# Patient Record
Sex: Male | Born: 1940
Health system: Southern US, Community
[De-identification: ages and names within clinical notes are randomized; demographics above are authoritative.]

## PROBLEM LIST (undated history)

## (undated) DIAGNOSIS — K219 Gastro-esophageal reflux disease without esophagitis: Secondary | ICD-10-CM

## (undated) DIAGNOSIS — M1990 Unspecified osteoarthritis, unspecified site: Secondary | ICD-10-CM

## (undated) DIAGNOSIS — E538 Deficiency of other specified B group vitamins: Secondary | ICD-10-CM

## (undated) DIAGNOSIS — I499 Cardiac arrhythmia, unspecified: Secondary | ICD-10-CM

## (undated) DIAGNOSIS — C801 Malignant (primary) neoplasm, unspecified: Secondary | ICD-10-CM

## (undated) DIAGNOSIS — Z86010 Personal history of colonic polyps: Secondary | ICD-10-CM

## (undated) DIAGNOSIS — K589 Irritable bowel syndrome without diarrhea: Secondary | ICD-10-CM

## (undated) DIAGNOSIS — K649 Unspecified hemorrhoids: Secondary | ICD-10-CM

## (undated) DIAGNOSIS — Z860101 Personal history of adenomatous and serrated colon polyps: Secondary | ICD-10-CM

## (undated) DIAGNOSIS — D649 Anemia, unspecified: Secondary | ICD-10-CM

## (undated) DIAGNOSIS — I10 Essential (primary) hypertension: Secondary | ICD-10-CM

## (undated) HISTORY — DX: Unspecified osteoarthritis, unspecified site: M19.90

## (undated) HISTORY — DX: Essential (primary) hypertension: I10

## (undated) HISTORY — PX: POLYPECTOMY: SHX149

## (undated) HISTORY — DX: Personal history of adenomatous and serrated colon polyps: Z86.0101

## (undated) HISTORY — DX: Deficiency of other specified B group vitamins: E53.8

## (undated) HISTORY — DX: Irritable bowel syndrome, unspecified: K58.9

## (undated) HISTORY — DX: Personal history of colonic polyps: Z86.010

## (undated) HISTORY — DX: Anemia, unspecified: D64.9

---
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
## 1988-09-18 ENCOUNTER — Encounter: Payer: Self-pay | Admitting: Internal Medicine

## 2002-04-26 ENCOUNTER — Encounter: Payer: Self-pay | Admitting: Internal Medicine

## 2004-05-08 ENCOUNTER — Encounter: Payer: Self-pay | Admitting: Internal Medicine

## 2005-06-07 ENCOUNTER — Ambulatory Visit: Payer: Self-pay | Admitting: Internal Medicine

## 2005-06-20 ENCOUNTER — Ambulatory Visit: Payer: Self-pay | Admitting: Internal Medicine

## 2005-07-04 ENCOUNTER — Encounter (INDEPENDENT_AMBULATORY_CARE_PROVIDER_SITE_OTHER): Payer: Self-pay | Admitting: Specialist

## 2005-07-04 ENCOUNTER — Ambulatory Visit: Payer: Self-pay | Admitting: Internal Medicine

## 2005-08-30 ENCOUNTER — Ambulatory Visit: Payer: Self-pay | Admitting: Internal Medicine

## 2006-06-10 ENCOUNTER — Ambulatory Visit: Payer: Self-pay | Admitting: Internal Medicine

## 2007-06-11 DIAGNOSIS — D126 Benign neoplasm of colon, unspecified: Secondary | ICD-10-CM | POA: Insufficient documentation

## 2007-06-11 DIAGNOSIS — M170 Bilateral primary osteoarthritis of knee: Secondary | ICD-10-CM | POA: Insufficient documentation

## 2007-06-11 DIAGNOSIS — K589 Irritable bowel syndrome without diarrhea: Secondary | ICD-10-CM | POA: Insufficient documentation

## 2007-06-13 DIAGNOSIS — M1990 Unspecified osteoarthritis, unspecified site: Secondary | ICD-10-CM | POA: Insufficient documentation

## 2007-06-16 ENCOUNTER — Ambulatory Visit: Payer: Self-pay | Admitting: Internal Medicine

## 2007-06-16 DIAGNOSIS — L538 Other specified erythematous conditions: Secondary | ICD-10-CM | POA: Insufficient documentation

## 2007-06-17 LAB — CONVERTED CEMR LAB: PSA: 3.85 ng/mL (ref 0.10–4.00)

## 2007-09-10 ENCOUNTER — Telehealth: Payer: Self-pay | Admitting: Internal Medicine

## 2008-07-06 ENCOUNTER — Telehealth: Payer: Self-pay | Admitting: Internal Medicine

## 2008-07-08 HISTORY — PX: EYE SURGERY: SHX253

## 2008-07-08 HISTORY — PX: CATARACT EXTRACTION: SUR2

## 2008-09-05 ENCOUNTER — Ambulatory Visit: Payer: Self-pay | Admitting: Internal Medicine

## 2008-09-06 LAB — CONVERTED CEMR LAB
AST: 16 units/L (ref 0–37)
Albumin: 3.9 g/dL (ref 3.5–5.2)
Alkaline Phosphatase: 63 units/L (ref 39–117)
Basophils Relative: 0.6 % (ref 0.0–3.0)
CO2: 29 meq/L (ref 19–32)
Calcium: 8.8 mg/dL (ref 8.4–10.5)
Creatinine, Ser: 1 mg/dL (ref 0.4–1.5)
Eosinophils Relative: 1.7 % (ref 0.0–5.0)
GFR calc Af Amer: 96 mL/min
GFR calc non Af Amer: 79 mL/min
LDL Cholesterol: 139 mg/dL — ABNORMAL HIGH (ref 0–99)
Lymphocytes Relative: 24 % (ref 12.0–46.0)
Monocytes Relative: 8.3 % (ref 3.0–12.0)
Neutrophils Relative %: 65.4 % (ref 43.0–77.0)
RBC: 4.12 M/uL — ABNORMAL LOW (ref 4.22–5.81)
Total Protein: 7 g/dL (ref 6.0–8.3)
VLDL: 11 mg/dL (ref 0–40)
WBC: 6.6 10*3/uL (ref 4.5–10.5)

## 2008-09-09 ENCOUNTER — Ambulatory Visit: Payer: Self-pay | Admitting: Internal Medicine

## 2009-08-02 ENCOUNTER — Ambulatory Visit: Payer: Self-pay | Admitting: Internal Medicine

## 2009-08-02 DIAGNOSIS — L03317 Cellulitis of buttock: Secondary | ICD-10-CM | POA: Insufficient documentation

## 2009-08-02 DIAGNOSIS — L0231 Cutaneous abscess of buttock: Secondary | ICD-10-CM | POA: Insufficient documentation

## 2009-09-21 ENCOUNTER — Ambulatory Visit: Payer: Self-pay | Admitting: Internal Medicine

## 2009-09-21 DIAGNOSIS — L570 Actinic keratosis: Secondary | ICD-10-CM | POA: Insufficient documentation

## 2009-11-20 ENCOUNTER — Telehealth: Payer: Self-pay | Admitting: Internal Medicine

## 2010-01-16 ENCOUNTER — Ambulatory Visit: Payer: Self-pay | Admitting: Internal Medicine

## 2010-05-10 ENCOUNTER — Encounter: Payer: Self-pay | Admitting: Internal Medicine

## 2010-08-07 NOTE — Letter (Signed)
Summary: Colonoscopy Letter  [facility]  [address] [place], [place] [address]   Phone: [phone]  Fax: [phone]      May 10, 2010 MRN: [id number]   Todd Hodges [address] [place], [place]  [address]   Dear [name],   According to your medical record, it is time for you to schedule a Colonoscopy. The American Cancer Society recommends this procedure as a method to detect early colon cancer. Patients with a family history of colon cancer, or a personal history of colon polyps or inflammatory bowel disease are at increased risk.  This letter has been generated based on the recommendations made at the time of your procedure. If you feel that in your particular situation this may no longer apply, please contact our office.  Please call our office at [phone] to schedule this appointment or to update your records at your earliest convenience.  Thank you for cooperating with [place] to provide you with the very best care possible.   Sincerely,  Wilhemina Bonito. Marina Goodell, M.D.  [facility] [phone]

## 2010-08-07 NOTE — Progress Notes (Signed)
Summary: Pt fell on Friday, presently in [facility]  Phone Note Call from Patient Call back at [phone]   Caller: Spouse Call For: Cindee Salt MD Summary of Call: Spouse called, stated fell on last Friday, pt was admitted to  [facility] and is still there as of today...fyi to PCP.Marland KitchenDaine Gip  Nov 20, 2009 4:41 PM  Initial call taken by: Daine Gip,  Nov 20, 2009 4:41 PM  Follow-up for Phone Call        actually mom fell and is in hospital I spoke to her has pelvic fracture  Please call  him and let him know I spoke to his mom and will await the decision about rehab (I only go to [facility]) Follow-up by: Cindee Salt MD,  Nov 20, 2009 4:45 PM  Additional Follow-up for Phone Call Additional follow up Details #1::        spoke with son and he states she might be going to [facility], not sure yet. Additional Follow-up by: Mervin Hack CMA (AAMA),  Nov 21, 2009 9:12 AM

## 2010-08-07 NOTE — Assessment & Plan Note (Signed)
Summary: 2:00 ?BOIL/CLE   Vital Signs:  Patient profile:   70 year old male Height:      65 inches Weight:      195 pounds BMI:     32.57 Temp:     98.8 degrees F oral BP sitting:   140 / 70  (left arm) Cuff size:   large  Vitals Entered By: Mervin Hack CMA Duncan Dull) (August 02, 2009 2:12 PM) CC: boil   History of Present Illness: Has boil on left buttock 5 days ago--he felt bad Has been stuffy since then Temp up to 100.5 very achy Hot and cold feelings this is better today  No sore throat no ear pain has had nasal congestion  Noted the boil about the same time---didn't pay much attention but now is worse No drainage fever is gone now  Allergies: No Known Drug Allergies  Past History:  Past medical, surgical, family and social histories (including risk factors) reviewed for relevance to current acute and chronic problems.  Past Medical History: Reviewed history from 06/11/2007 and no changes required. Colonic polyps, hx of--adenomatous Osteoarthritis--knees Irritable bowel syndrome  Past Surgical History: Reviewed history from 06/11/2007 and no changes required. 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 Father: Died at age 34, 3 CVS's Mother: Alive in 25's 2 Sisters-- one alive with benign brain tumor, one alive with neck cancer No CAD or DM HTN in Dad No prostate or colon cancer  Social History: Reviewed history from 09/09/2008 and no changes required. Marital Status: Divorced Children: One daughter Occupation: Education administrator days per week Never Smoked Alcohol use-no Mom living with him now  Reviewed living will Asks for daughter to make decisions if he can't Wants resuscitation attempts discussed tube feedings--not sure at present but doesn't want prolonged Rx if disabled  Review of Systems       no vomiting or diarrhea appetite is off  Physical  Exam  General:  alert and normal appearance.   Head:  no sinus tenderness Ears:  R ear normal and L ear normal.   Nose:  no sig inflammation Mouth:  no erythema and no exudates.   Neck:  supple, no masses, and no cervical lymphadenopathy.   Lungs:  normal respiratory effort and normal breath sounds.   Skin:  boil on left buttock with 3cm of warmth and surrounding redness   Impression & Recommendations:  Problem # 1:  INFLUENZA, WITH RESPIRATORY SYMPTOMS (ICD-487.1) Assessment New seems to have mostly run its course no Rx needed  Problem # 2:  CELLULITIS, BUTTOCKS (ICD-682.5) Assessment: New  suspicious for  MRSA will have him conitnue warm compresses clindamycin for 1 weeks  His updated medication list for this problem includes:    Clindamycin Hcl 300 Mg Caps (Clindamycin hcl) .Marland Kitchen... 1 tab by mouth three times a day for 7 days for skin infection  Complete Medication List: 1)  Aspirin 81 Mg Tbec (Aspirin) .... Take one by mouth every other day 2)  Glucosamine-chondroitin 1500-1200 Mg/50ml Liqd (Glucosamine-chondroitin) .... Take 2 tablets by mouth once daily 3)  Fiber Laxative 625 Mg Tabs (Calcium polycarbophil) .... As needed 4)  Vitamin B-12 1000 Mcg Tabs (Cyanocobalamin) .... Once daily 5)  Clindamycin Hcl 300 Mg Caps (Clindamycin hcl) .Marland Kitchen.. 1 tab by mouth three times a day for 7 days for skin infection  Patient Instructions: 1)  Please keep regular appt in March Prescriptions: CLINDAMYCIN HCL 300 MG CAPS (CLINDAMYCIN HCL)  1 tab by mouth three times a day for 7 days for skin infection  #21 x 1   Entered and Authorized by:   Cindee Salt MD   Signed by:   Cindee Salt MD on 08/02/2009   Method used:   Electronically to        [facility] [address] (retail)       [address]       [place], [place]  [address]       Ph: [phone]       Fax: [phone]   RxID:   [id number]   Current Allergies (reviewed today): No known allergies

## 2010-08-07 NOTE — Assessment & Plan Note (Signed)
Summary: SHINGLES VACCINE/DLO  Nurse Visit   Allergies: No Known Drug Allergies  Immunizations Administered:  Zostavax # 1:    Vaccine Type: Zostavax    Site: left deltoid    Mfr: Merck    Dose: 0.65    Route: Converse    Given by: Mervin Hack CMA (AAMA)    Exp. Date: 01/31/2011    Lot #: [id number]    VIS given: 04/19/05 given January 16, 2010.  Orders Added: 1)  Zoster (Shingles) Vaccine Live [90736] 2)  Admin 1st Vaccine [phone]

## 2010-08-07 NOTE — Assessment & Plan Note (Signed)
Summary: ROA  CYD   Vital Signs:  Patient profile:   70 year old male Weight:      199 pounds Temp:     97.8 degrees F oral BP sitting:   136 / 70  (left arm) Cuff size:   large  Vitals Entered By: Mervin Hack CMA Duncan Dull) (September 21, 2009 3:34 PM) CC: follow-up visit   History of Present Illness: He wants to go ahead with physica; Boil on buttock did resolve  No new concerns Has been recommended to take the shingles vaccine I did give Rx   Allergies: No Known Drug Allergies  Past History:  Past medical, surgical, family and social histories (including risk factors) reviewed for relevance to current acute and chronic problems.  Past Medical History: Reviewed history from 06/11/2007 and no changes required. Colonic polyps, hx of--adenomatous Osteoarthritis--knees Irritable bowel syndrome  Past Surgical History: 1950's Branchial cleft cyst left ear 2010 Cataracts bilaterally  Family History: Reviewed history from 06/16/2007 and no changes required. 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 Eyes:  Denies blurring and vision loss-1 eye. ENT:  Denies decreased hearing and ringing in ears; teeth okay---regular with dentist. CV:  Denies chest pain or discomfort, difficulty breathing at night,  difficulty breathing while lying down, fainting, lightheadness, palpitations, and shortness of breath with exertion. Resp:  Denies cough and shortness of breath. GI:  Complains of change in bowel habits; denies bloody stools, dark tarry stools, indigestion, nausea, and vomiting; bowels have off since on antibiotic 2 months ago. GU:  Complains of nocturia; denies erectile dysfunction, urinary frequency, and urinary hesitancy; occ nocturia occ urgency after coffee. MS:  Complains of joint pain and muscle aches; denies joint swelling; pain better with glucosamine chondroitin. Derm:  Complains of lesion(s); denies rash; has scaly area on left forehead. Neuro:  Denies headaches, numbness, tingling, and weakness. Psych:  Denies anxiety and depression. Heme:  Denies abnormal bruising and enlarge lymph nodes. Allergy:  Complains of seasonal allergies and sneezing; occ allergy symptoms--rarely uses OTC med.  Physical Exam  General:  alert and normal appearance.   Eyes:  pupils equal, pupils round, pupils reactive to light, and no optic disk abnormalities.   Ears:  R ear normal and L ear normal.   Mouth:  no erythema and no lesions.   Neck:  supple, no masses, no thyromegaly, no carotid bruits, and no cervical lymphadenopathy.   Lungs:  normal respiratory effort and normal breath sounds.   Heart:  normal rate, regular rhythm, no murmur, and no gallop.   Abdomen:  soft and non-tender.   Rectal:  deferred --almost 70 years old Msk:  no joint tenderness and no joint swelling.   Pulses:  normal in feet Extremities:  no edema Neurologic:  alert & oriented X3, strength normal in all extremities, and gait normal.  Skin:  actinic on left forehead no other sig lesions Axillary Nodes:  No palpable lymphadenopathy Psych:  normally interactive, good eye contact, not anxious appearing, and not depressed appearing.     Impression & Recommendations:  Problem # 1:  PREVENTIVE HEALTH CARE  (ICD-V70.0) Assessment Comment Only  doing well will just check glucose check PSA--probably the last time  Orders: TLB-Glucose, QUANT ([id number])  Problem # 2:  ACTINIC KERATOSIS (ICD-702.0) Assessment: New  treated with liquid nitrogen 40 seconds x 2 tolerated well  Orders: Cryotherapy/Destruction benign or premalignant lesion (1st lesion)  (17000)  Complete Medication List: 1)  Aspirin 81 Mg Tbec (Aspirin) .... Take one by mouth every other day 2)  Glucosamine-chondroitin 1500-1200 Mg/2ml Liqd (Glucosamine-chondroitin) .... Take 2 tablets by mouth once daily 3)  Fiber Laxative 625 Mg Tabs (Calcium polycarbophil) .... As needed 4)  Vitamin B-12 1000 Mcg Tabs (Cyanocobalamin) .... Once daily  Other Orders: TLB-PSA (Prostate Specific Antigen) ([id number]) Venipuncture (10272)  Patient Instructions: 1)  Please try a probioitc --either as a pill or in Activia yogurt 2)  Please schedule a follow-up appointment in 1 year.   Current Allergies (reviewed today): No known allergies

## 2010-09-04 ENCOUNTER — Encounter: Payer: Self-pay | Admitting: Internal Medicine

## 2010-09-04 LAB — HM COLONOSCOPY

## 2010-09-27 ENCOUNTER — Ambulatory Visit (INDEPENDENT_AMBULATORY_CARE_PROVIDER_SITE_OTHER): Payer: MEDICARE | Admitting: Internal Medicine

## 2010-09-27 ENCOUNTER — Encounter: Payer: Self-pay | Admitting: Internal Medicine

## 2010-09-27 VITALS — BP 154/83 | HR 54 | Temp 98.3°F | Ht 65.5 in | Wt 195.0 lb

## 2010-09-27 DIAGNOSIS — M129 Arthropathy, unspecified: Secondary | ICD-10-CM

## 2010-09-27 DIAGNOSIS — Z79899 Other long term (current) drug therapy: Secondary | ICD-10-CM

## 2010-09-27 DIAGNOSIS — K589 Irritable bowel syndrome without diarrhea: Secondary | ICD-10-CM

## 2010-09-27 DIAGNOSIS — Z0000 Encounter for general adult medical examination without abnormal findings: Secondary | ICD-10-CM | POA: Insufficient documentation

## 2010-09-27 DIAGNOSIS — E538 Deficiency of other specified B group vitamins: Secondary | ICD-10-CM

## 2010-09-27 NOTE — Progress Notes (Signed)
Addended byMills Koller on: 09/27/2010 05:26 PM   Modules accepted: Orders

## 2010-09-27 NOTE — Progress Notes (Signed)
Subjective:    Patient ID: Todd Hodges, male    DOB: [birth year], 70 y.o.   MRN: [id number]  HPI Doing well Has spot on leg he wants checked Goes back a couple of weeks No pain or discharge  Checks BP at grocery store Seems to be going up so he checked on his own machine Variable but as low as 115/67 and as high as 160/81  Many systolics under 130 Discussed lifestyle measures  Asks about colonoscopy Counselled that since he had adenomatous polyp--he should get it again now that it is 5 years  Past Medical History  Diagnosis Date  . Hx of adenomatous colonic polyps   . Osteoporosis     knees  . IBS (irritable bowel syndrome)   . Vitamin B12 deficiency     has responded to oral therapy    Past Surgical History  Procedure Date  . Eye surgery 07/08/2008    cataracts bilaterally  . Branchial cleft cyst excision 07/08/1948    cyst left ear  . Cataract extraction 2010    Bilateral    Family History  Problem Relation Age of Onset  . Hypertension Father   . Cancer Sister     neck  . Heart disease Neg Hx   . Diabetes Neg Hx   . Prostate cancer Neg Hx   . Colon cancer Neg Hx     History   Social History  . Marital Status: Divorced    Spouse Name: N/A    Number of Children: 1  . Years of Education: N/A   Occupational History  . Electronic engineer/4 days per week    Social History Main Topics  . Smoking status: Never Smoker   . Smokeless tobacco: Not on file  . Alcohol Use: No  . Drug Use:   . Sexually Active:    Other Topics Concern  . Not on file   Social History Narrative  . No narrative on file     Review of Systems  Constitutional: Negative for activity change, fatigue and unexpected weight change.       Continues to exercise regularly  Weight stable Wears seat belt  HENT: Positive for hearing loss and rhinorrhea. Negative for sneezing, dental problem and tinnitus.        Some trouble hearing when lots of background noise  Rare allergy  symptoms---uses OTC med prn   Eyes: Positive for visual disturbance.       Has some trouble with headlights at night since cataract surgery No diplopia  Cardiovascular: Negative for chest pain, palpitations and leg swelling.  Gastrointestinal: Negative for abdominal pain, constipation and blood in stool.       Less trouble with bowels than ever before No sig heartburn  Genitourinary: Negative for decreased urine volume and difficulty urinating.       Nocturia x 1-2 No sexual problems  Musculoskeletal: Positive for arthralgias. Negative for joint swelling and gait problem.       Occ knee problems---back to running up to a mile since starting the glucosamine and chondroitin  Skin: Negative for rash.       Does see dermatologist at times  Neurological: Negative for dizziness, syncope, weakness, numbness and headaches.  Hematological: Negative for adenopathy. Does not bruise/bleed easily.  Psychiatric/Behavioral: Negative for dysphoric mood. The patient is not nervous/anxious.        Objective:   Physical Exam  Constitutional: He is oriented to person, place, and time. He appears well-developed and well-nourished. No distress.  HENT:  Head: Normocephalic and atraumatic.  Right Ear: External ear normal.  Left Ear: External ear normal.  Mouth/Throat: Oropharynx is clear and moist. No oropharyngeal exudate.       No oral lesions  Eyes: Conjunctivae and EOM are normal. Pupils are equal, round, and reactive to light.       mioitic pupils Limited fundus exam but disks look sharp  Neck: Normal range of motion. Neck supple. No JVD present. No thyromegaly present.  Cardiovascular: Regular rhythm, normal heart sounds and intact distal pulses.  Exam reveals no gallop.   No murmur heard.      Always bradycardic---in 50's  Pulmonary/Chest: Effort normal and breath sounds normal. No respiratory distress. He has no wheezes. He has no rales.  Abdominal: Soft. Bowel sounds are normal. He exhibits no  distension and no mass. There is no tenderness.  Genitourinary:       Prostate exam deferred after discussion  Musculoskeletal: Normal range of motion. He exhibits no edema and no tenderness.  Lymphadenopathy:    He has no cervical adenopathy.  Neurological: He is alert and oriented to person, place, and time.       Normal strength and gait  Skin: Skin is warm and dry. No erythema.       Slight red area on medial right calf--looks like slight contact derm  Psychiatric: He has a normal mood and affect. Judgment and thought content normal.          Assessment & Plan:

## 2010-09-28 LAB — CBC WITH DIFFERENTIAL/PLATELET
Basophils Relative: 0.4 % (ref 0.0–3.0)
Eosinophils Relative: 1.7 % (ref 0.0–5.0)
Lymphocytes Relative: 23.4 % (ref 12.0–46.0)
Neutrophils Relative %: 66.5 % (ref 43.0–77.0)
Platelets: 220 10*3/uL (ref 150.0–400.0)
RBC: 3.68 Mil/uL — ABNORMAL LOW (ref 4.22–5.81)
WBC: 7.8 10*3/uL (ref 4.5–10.5)

## 2010-09-28 LAB — BASIC METABOLIC PANEL
CO2: 25 mEq/L (ref 19–32)
Calcium: 8.7 mg/dL (ref 8.4–10.5)
Chloride: 107 mEq/L (ref 96–112)
Creatinine, Ser: 1 mg/dL (ref 0.4–1.5)
Glucose, Bld: 64 mg/dL — ABNORMAL LOW (ref 70–99)
Sodium: 140 mEq/L (ref 135–145)

## 2010-09-28 LAB — HEPATIC FUNCTION PANEL
ALT: 12 U/L (ref 0–53)
AST: 18 U/L (ref 0–37)
Bilirubin, Direct: 0.1 mg/dL (ref 0.0–0.3)
Total Bilirubin: 0.4 mg/dL (ref 0.3–1.2)

## 2010-09-28 LAB — TSH: TSH: 1.45 u[IU]/mL (ref 0.35–5.50)

## 2010-12-17 ENCOUNTER — Ambulatory Visit (AMBULATORY_SURGERY_CENTER): Payer: Medicare Other | Admitting: *Deleted

## 2010-12-17 VITALS — Ht 67.0 in | Wt 188.0 lb

## 2010-12-17 DIAGNOSIS — Z86010 Personal history of colonic polyps: Secondary | ICD-10-CM

## 2010-12-17 MED ORDER — PEG-KCL-NACL-NASULF-NA ASC-C 100 G PO SOLR: ORAL | Status: DC

## 2010-12-31 ENCOUNTER — Encounter: Payer: Self-pay | Admitting: Internal Medicine

## 2010-12-31 ENCOUNTER — Ambulatory Visit (AMBULATORY_SURGERY_CENTER): Payer: Medicare Other | Admitting: Internal Medicine

## 2010-12-31 VITALS — BP 145/78 | HR 46 | Temp 97.1°F | Resp 18 | Ht 68.4 in | Wt 186.0 lb

## 2010-12-31 DIAGNOSIS — Z1211 Encounter for screening for malignant neoplasm of colon: Secondary | ICD-10-CM

## 2010-12-31 DIAGNOSIS — D126 Benign neoplasm of colon, unspecified: Secondary | ICD-10-CM

## 2010-12-31 DIAGNOSIS — Z86010 Personal history of colonic polyps: Secondary | ICD-10-CM

## 2010-12-31 DIAGNOSIS — K5730 Diverticulosis of large intestine without perforation or abscess without bleeding: Secondary | ICD-10-CM

## 2010-12-31 MED ORDER — SODIUM CHLORIDE 0.9 % IV SOLN: 500.0000 mL | INTRAVENOUS | Status: DC

## 2010-12-31 NOTE — Patient Instructions (Signed)
Please read blue and green discharge instruction sheets. No driving, no operating any kind of equipment, no signing legal documentation, and NO alcohol today.

## 2011-01-01 ENCOUNTER — Telehealth: Payer: Self-pay | Admitting: *Deleted

## 2011-01-01 NOTE — Telephone Encounter (Signed)
No answer, no ID on answering machine, no message left. 

## 2011-12-13 ENCOUNTER — Ambulatory Visit (INDEPENDENT_AMBULATORY_CARE_PROVIDER_SITE_OTHER): Payer: Medicare Other | Admitting: Internal Medicine

## 2011-12-13 ENCOUNTER — Encounter: Payer: Self-pay | Admitting: Internal Medicine

## 2011-12-13 VITALS — BP 120/72 | HR 57 | Temp 98.2°F | Ht 65.5 in | Wt 195.0 lb

## 2011-12-13 DIAGNOSIS — M129 Arthropathy, unspecified: Secondary | ICD-10-CM

## 2011-12-13 DIAGNOSIS — E538 Deficiency of other specified B group vitamins: Secondary | ICD-10-CM

## 2011-12-13 DIAGNOSIS — B356 Tinea cruris: Secondary | ICD-10-CM | POA: Insufficient documentation

## 2011-12-13 DIAGNOSIS — Z0000 Encounter for general adult medical examination without abnormal findings: Secondary | ICD-10-CM

## 2011-12-13 LAB — CBC WITH DIFFERENTIAL/PLATELET
Basophils Relative: 0.7 % (ref 0.0–3.0)
Eosinophils Relative: 1.5 % (ref 0.0–5.0)
HCT: 40.2 % (ref 39.0–52.0)
Hemoglobin: 13.1 g/dL (ref 13.0–17.0)
Lymphocytes Relative: 24.7 % (ref 12.0–46.0)
Lymphs Abs: 1.8 10*3/uL (ref 0.7–4.0)
Monocytes Relative: 10.7 % (ref 3.0–12.0)
Neutro Abs: 4.5 10*3/uL (ref 1.4–7.7)
RBC: 4.03 Mil/uL — ABNORMAL LOW (ref 4.22–5.81)
WBC: 7.1 10*3/uL (ref 4.5–10.5)

## 2011-12-13 LAB — BASIC METABOLIC PANEL
GFR: 69.93 mL/min (ref >60.00)
Glucose, Bld: 44 mg/dL — CL (ref 70–99)
Potassium: 3.8 mEq/L (ref 3.5–5.1)
Sodium: 138 mEq/L (ref 135–145)

## 2011-12-13 LAB — HEPATIC FUNCTION PANEL
ALT: 12 U/L (ref 0–53)
AST: 18 U/L (ref 0–37)
Albumin: 3.8 g/dL (ref 3.5–5.2)
Alkaline Phosphatase: 70 U/L (ref 39–117)

## 2011-12-13 LAB — VITAMIN B12: Vitamin B-12: 843 pg/mL (ref 211–911)

## 2011-12-13 MED ORDER — HYDROCORTISONE 2.5 % EX CREA: TOPICAL_CREAM | Freq: Two times a day (BID) | CUTANEOUS | Status: DC | PRN

## 2011-12-13 NOTE — Assessment & Plan Note (Signed)
Healthy Just had colonoscopy No PSA

## 2011-12-13 NOTE — Assessment & Plan Note (Signed)
Okay with current meds Stays in shape

## 2011-12-13 NOTE — Assessment & Plan Note (Signed)
Okay with oral replacement Will check labs

## 2011-12-13 NOTE — Assessment & Plan Note (Signed)
Only partial relief with the ketoconazole Discussed powder to dry out Hydrocortisone 2.5% to help itch

## 2011-12-13 NOTE — Progress Notes (Signed)
Subjective:    Patient ID: Todd Hodges, male    DOB: 08-27-1940, 71 y.o.   MRN: [id number]  HPI Here for physical  Did have jock itch and got partial relief from ketoconazole cream (got from dermatologist) Has rash at top of buttocks that tends to stay Gets hot in winter but not bad this time of year Still has mild lesions  Otherwise doing okay Banged left hand recently---some bruising but is improved now  Knee arthritis persists Uses exercise bike which is okay Can run very short distances  Current Outpatient Prescriptions on File Prior to Visit  Medication Sig Dispense Refill  . aspirin 81 MG tablet Take 81 mg by mouth every other day.        . Glucosamine-Chondroitin 1500-1200 MG/30ML LIQD Take 2 tablets by mouth daily.        . polycarbophil (FIBER LAXATIVE) 625 MG tablet Take 625 mg by mouth daily.        . vitamin B-12 (CYANOCOBALAMIN) 1000 MCG tablet Take 1,000 mcg by mouth daily.         Current Facility-Administered Medications on File Prior to Visit  Medication Dose Route Frequency Provider Last Rate Last Dose  . 0.9 %  sodium chloride infusion  500 mL Intravenous Continuous Hilarie Fredrickson, MD        No Known Allergies  Past Medical History  Diagnosis Date  . Hx of adenomatous colonic polyps   . Osteoporosis     knees  . IBS (irritable bowel syndrome)   . Vitamin B12 deficiency     has responded to oral therapy  . Anemia     b12 deficiency  . Arthritis   . Hypertension   . Cataract     had surgery on both eyes    Past Surgical History  Procedure Date  . Eye surgery 07/08/2008    cataracts bilaterally  . Branchial cleft cyst excision 07/08/1948    cyst left ear  . Cataract extraction 2010    Bilateral  . Colonoscopy   . Polypectomy     Family History  Problem Relation Age of Onset  . Hypertension Father   . Cancer Sister     neck  . Heart disease Neg Hx   . Diabetes Neg Hx   . Prostate cancer Neg Hx   . Colon cancer Neg Hx      History   Social History  . Marital Status: Divorced    Spouse Name: N/A    Number of Children: 1  . Years of Education: N/A   Occupational History  . Retired     as Camera operator   Social History Main Topics  . Smoking status: Never Smoker   . Smokeless tobacco: Never Used  . Alcohol Use: No  . Drug Use: No  . Sexually Active: Not on file   Other Topics Concern  . Not on file   Social History Narrative   Thinks he has living willRequests lady friend, Todd Hodges, to make health care decisions. Gave packet of forms to do formal determinationWould accept resuscitation attempts No feeding tube if cognitively unaware   Review of Systems  Constitutional: Negative for fatigue and unexpected weight change.       Wears seat belt  HENT: Negative for hearing loss, congestion, rhinorrhea, dental problem and tinnitus.        Regular with dentist Mild spring allergies---not regular with meds  Eyes: Negative for visual disturbance.  No diplopia or unilateral vision loss  Respiratory: Negative for cough, chest tightness and shortness of breath.   Cardiovascular: Negative for chest pain, palpitations and leg swelling.  Gastrointestinal: Negative for nausea, vomiting, abdominal pain, constipation and blood in stool.       No heartburn  Genitourinary: Positive for difficulty urinating.       Slow stream Nocturia x 1-2  Mild ED---doesn't want meds  Musculoskeletal: Positive for arthralgias. Negative for back pain and joint swelling.  Skin: Positive for rash.       No suspicious areas Sees Dr Adolphus Birchwood for routine visits  Neurological: Negative for dizziness, syncope, weakness, light-headedness and headaches.  Hematological: Positive for adenopathy. Bruises/bleeds easily.       Feels glands under jaw---chronic for all life Easy bruising on aspirin  Psychiatric/Behavioral: Negative for sleep disturbance and dysphoric mood. The patient is not nervous/anxious.         Objective:   Physical Exam  Constitutional: He is oriented to person, place, and time. He appears well-developed and well-nourished. No distress.  HENT:  Head: Normocephalic and atraumatic.  Right Ear: External ear normal.  Left Ear: External ear normal.  Mouth/Throat: Oropharynx is clear and moist. No oropharyngeal exudate.  Eyes: Conjunctivae and EOM are normal. Pupils are equal, round, and reactive to light.  Neck: Normal range of motion. Neck supple. No thyromegaly present.  Cardiovascular: Normal rate, regular rhythm, normal heart sounds and intact distal pulses.  Exam reveals no gallop.   No murmur heard. Pulmonary/Chest: Effort normal and breath sounds normal. No respiratory distress. He has no wheezes. He has no rales.  Abdominal: Soft. There is no tenderness.  Musculoskeletal: Normal range of motion. He exhibits no edema and no tenderness.  Lymphadenopathy:    He has no cervical adenopathy.  Neurological: He is alert and oriented to person, place, and time. He exhibits normal muscle tone. Coordination normal.  Skin: Rash noted.       Mild inguinal intertrigo Slight redness at pilonidal area  Psychiatric: He has a normal mood and affect. His behavior is normal.          Assessment & Plan:

## 2011-12-16 ENCOUNTER — Encounter: Payer: Self-pay | Admitting: *Deleted

## 2011-12-26 ENCOUNTER — Telehealth: Payer: Self-pay | Admitting: *Deleted

## 2011-12-26 NOTE — Telephone Encounter (Signed)
.  left message to have patient return my call. Patient needs to schedule nurse visit for fasting glucose finger stick, per Dr.Letvak. ( see scanned letter)

## 2011-12-27 ENCOUNTER — Ambulatory Visit (INDEPENDENT_AMBULATORY_CARE_PROVIDER_SITE_OTHER): Payer: Medicare Other | Admitting: Internal Medicine

## 2011-12-27 ENCOUNTER — Encounter: Payer: Self-pay | Admitting: Internal Medicine

## 2011-12-27 DIAGNOSIS — E162 Hypoglycemia, unspecified: Secondary | ICD-10-CM

## 2011-12-27 DIAGNOSIS — R7309 Other abnormal glucose: Secondary | ICD-10-CM

## 2011-12-27 LAB — GLUCOSE, POCT (MANUAL RESULT ENTRY): POC Glucose: 87 mg/dl (ref 70–99)

## 2011-12-27 NOTE — Assessment & Plan Note (Signed)
Discussed the normal value with him Very reassuring

## 2011-12-27 NOTE — Progress Notes (Signed)
Subjective:     Patient ID: Todd Hodges, male   DOB: August 08, 1940, 71 y.o.   MRN: [id number]  HPI   Review of Systems     Objective:   Physical Exam     Assessment:     Here for finger stick sugar check per Dr. Alphonsus Sias due to abnormal sugar results on fasting lab results. POCT done and results sent to Dr. Alphonsus Sias. Patient instructed to await to hear from [facility] in regards to further instructions.     Plan:

## 2011-12-27 NOTE — Progress Notes (Signed)
  Subjective:    Patient ID: Todd Hodges, male    DOB: 1940-09-24, 71 y.o.   MRN: [id number]  HPI    Review of Systems     Objective:   Physical Exam        Assessment & Plan:

## 2011-12-27 NOTE — Telephone Encounter (Signed)
Patient came in for nurse visit 

## 2012-12-14 ENCOUNTER — Telehealth: Payer: Self-pay | Admitting: Internal Medicine

## 2012-12-14 NOTE — Telephone Encounter (Signed)
Pt had an apptmt for CPE on Wednesday, 02-Jul-2024but due to death of his mother, he had to r/s the apptmt. The first thing available is not until 06/11/2013.  Can you accommodate him a sooner appmt for CPE? Thank you.

## 2012-12-14 NOTE — Telephone Encounter (Signed)
I spoke to him to offer condolences It was not a surprise  Todd Hodges,  Please call him later this week and get him in for his physical in the next 1-2 months

## 2012-12-16 ENCOUNTER — Encounter: Payer: Self-pay | Admitting: Internal Medicine

## 2012-12-16 ENCOUNTER — Encounter: Payer: Medicare Other | Admitting: Internal Medicine

## 2012-12-16 ENCOUNTER — Ambulatory Visit (INDEPENDENT_AMBULATORY_CARE_PROVIDER_SITE_OTHER): Payer: Medicare Other | Admitting: Internal Medicine

## 2012-12-16 VITALS — BP 150/80 | HR 47 | Temp 97.7°F | Ht 65.5 in | Wt 191.0 lb

## 2012-12-16 DIAGNOSIS — Z0000 Encounter for general adult medical examination without abnormal findings: Secondary | ICD-10-CM

## 2012-12-16 DIAGNOSIS — M129 Arthropathy, unspecified: Secondary | ICD-10-CM

## 2012-12-16 NOTE — Assessment & Plan Note (Signed)
Healthy Continues to be active BP up but mom just died--stress, etc

## 2012-12-16 NOTE — Assessment & Plan Note (Signed)
Discussed trying acetaminophen

## 2012-12-16 NOTE — Progress Notes (Signed)
Subjective:    Patient ID: Todd Hodges, male    DOB: 1940-10-17, 72 y.o.   MRN: [id number]  HPI Here for physical Mom just died -- will be buried [place] Had seen her frequently---will need to adjust Reviewed advanced directives  Exercises regularly Does get knee pain regularly Was on glucosamine/chondroitin---now off to see if they were doing anything Doesn't use OTC analgesics---discussed  Current Outpatient Prescriptions on File Prior to Visit  Medication Sig Dispense Refill  . aspirin 81 MG tablet Take 81 mg by mouth every other day.        . polycarbophil (FIBER LAXATIVE) 625 MG tablet Take 625 mg by mouth daily.        . vitamin B-12 (CYANOCOBALAMIN) 1000 MCG tablet Take 1,000 mcg by mouth daily.         No current facility-administered medications on file prior to visit.    No Known Allergies  Past Medical History  Diagnosis Date  . Hx of adenomatous colonic polyps   . Osteoporosis     knees  . IBS (irritable bowel syndrome)   . Vitamin B12 deficiency     has responded to oral therapy  . Anemia     b12 deficiency  . Arthritis   . Hypertension   . Cataract     had surgery on both eyes    Past Surgical History  Procedure Laterality Date  . Eye surgery  07/08/2008    cataracts bilaterally  . Branchial cleft cyst excision  07/08/1948    cyst left ear  . Cataract extraction  2010    Bilateral  . Colonoscopy    . Polypectomy      Family History  Problem Relation Age of Onset  . Hypertension Father   . Cancer Sister     neck  . Heart disease Neg Hx   . Diabetes Neg Hx   . Prostate cancer Neg Hx   . Colon cancer Neg Hx     History   Social History  . Marital Status: Divorced    Spouse Name: N/A    Number of Children: 1  . Years of Education: N/A   Occupational History  . Retired     as Camera operator   Social History Main Topics  . Smoking status: Never Smoker   . Smokeless tobacco: Never Used  . Alcohol Use: No  . Drug Use: No   . Sexually Active: Not on file   Other Topics Concern  . Not on file   Social History Narrative   Has living will   Requests lady friend, Arnetha Massy, to make health care decisions.   Would accept resuscitation attempts    No feeding tube if cognitively unaware   Review of Systems  Constitutional: Negative for fatigue and unexpected weight change.       Wears seat belt  HENT: Positive for hearing loss. Negative for congestion, rhinorrhea, dental problem and tinnitus.        Stable hearing loss---high frequency Regular with dentist  Eyes: Positive for visual disturbance.       Keeps up with Dr Deedra Ehrich eye has been changing (cataract clouding some)  Respiratory: Negative for cough, chest tightness and shortness of breath.   Cardiovascular: Negative for chest pain, palpitations and leg swelling.  Gastrointestinal: Negative for nausea, vomiting, abdominal pain, constipation and blood in stool.       No heartburn   Endocrine: Negative for cold intolerance and heat intolerance.  Genitourinary: Positive for difficulty  urinating. Negative for urgency and frequency.       Slight dribbling No sexual problems  Musculoskeletal: Positive for arthralgias. Negative for back pain and joint swelling.       Knee and ankle pain  Skin: Negative for rash.       Keeps up with Dr Zada Girt   Allergic/Immunologic: Negative for environmental allergies and immunocompromised state.  Neurological: Negative for dizziness, syncope, weakness, light-headedness, numbness and headaches.  Hematological: Positive for adenopathy. Bruises/bleeds easily.       Intermittent swelling under left jaw Aspirin causes easy bruising  Psychiatric/Behavioral: Negative for sleep disturbance and dysphoric mood. The patient is not nervous/anxious.        Objective:   Physical Exam  Constitutional: He is oriented to person, place, and time. He appears well-developed and well-nourished. No distress.  HENT:   Head: Normocephalic and atraumatic.  Right Ear: External ear normal.  Left Ear: External ear normal.  Mouth/Throat: Oropharynx is clear and moist. No oropharyngeal exudate.  Eyes: Conjunctivae and EOM are normal. Pupils are equal, round, and reactive to light.  Neck: Normal range of motion. Neck supple. No thyromegaly present.  Cardiovascular: Normal rate, regular rhythm, normal heart sounds and intact distal pulses.  Exam reveals no gallop.   No murmur heard. Pulmonary/Chest: Effort normal and breath sounds normal. No respiratory distress. He has no wheezes. He has no rales.  Abdominal: Soft. There is no tenderness.  Musculoskeletal: He exhibits no edema and no tenderness.  Lymphadenopathy:    He has no cervical adenopathy.  Neurological: He is alert and oriented to person, place, and time.  Skin: No rash noted. No erythema.  Psychiatric: He has a normal mood and affect. His behavior is normal.          Assessment & Plan:

## 2012-12-16 NOTE — Telephone Encounter (Signed)
Patient scheduled cpx appointment on 12/16/12.

## 2013-03-25 ENCOUNTER — Emergency Department: Payer: Self-pay | Admitting: Emergency Medicine

## 2013-04-02 ENCOUNTER — Emergency Department: Payer: Self-pay | Admitting: Emergency Medicine

## 2013-05-13 ENCOUNTER — Other Ambulatory Visit: Payer: Self-pay

## 2013-06-11 ENCOUNTER — Encounter: Payer: Medicare Other | Admitting: Internal Medicine

## 2013-06-18 ENCOUNTER — Other Ambulatory Visit: Payer: Self-pay | Admitting: Internal Medicine

## 2013-11-10 ENCOUNTER — Telehealth: Payer: Self-pay

## 2013-11-10 NOTE — Telephone Encounter (Signed)
Pt spoke with Tor Netters requesting his pharmacy be changed to  [place]. Vaughan Basta advised pt done.

## 2013-11-11 ENCOUNTER — Encounter: Payer: Self-pay | Admitting: Internal Medicine

## 2013-11-11 ENCOUNTER — Ambulatory Visit (INDEPENDENT_AMBULATORY_CARE_PROVIDER_SITE_OTHER): Payer: Commercial Managed Care - HMO | Admitting: Internal Medicine

## 2013-11-11 VITALS — BP 138/70 | HR 58 | Temp 98.3°F | Wt 185.0 lb

## 2013-11-11 DIAGNOSIS — J019 Acute sinusitis, unspecified: Secondary | ICD-10-CM | POA: Insufficient documentation

## 2013-11-11 MED ORDER — AMOXICILLIN 500 MG PO TABS: 1000.0000 mg | ORAL_TABLET | Freq: Two times a day (BID) | ORAL | Status: DC

## 2013-11-11 NOTE — Progress Notes (Signed)
Subjective:    Patient ID: Todd Hodges, male    DOB: 1940/10/05, 73 y.o.   MRN: [id number]  HPI Got a cold about 3 weeks ago Seemed better after a week and resumed normal activity Symptoms resumed a couple of days later and persists  Green and yellow nasal drainage Some blood also yesterday May have had some fever Very congested Feels okay in AM--- then gets worse (may be after eating)  Not much cough---just some from PND No headache No ear pain Glands are tender in neck  Hasn't taken anything other than tylenol--didn't really help  Current Outpatient Prescriptions on File Prior to Visit  Medication Sig Dispense Refill  . aspirin 81 MG tablet Take 81 mg by mouth every other day.        . hydrocortisone 2.5 % cream APPLY TOPICALLY 2 (TWO) TIMES DAILY AS NEEDED FOR ITCHING.  29 g  4  . polycarbophil (FIBER LAXATIVE) 625 MG tablet Take 625 mg by mouth daily.        . vitamin B-12 (CYANOCOBALAMIN) 1000 MCG tablet Take 1,000 mcg by mouth daily.         No current facility-administered medications on file prior to visit.    No Known Allergies  Past Medical History  Diagnosis Date  . Hx of adenomatous colonic polyps   . Osteoporosis     knees  . IBS (irritable bowel syndrome)   . Vitamin B12 deficiency     has responded to oral therapy  . Anemia     b12 deficiency  . Arthritis   . Hypertension   . Cataract     had surgery on both eyes    Past Surgical History  Procedure Laterality Date  . Eye surgery  07/08/2008    cataracts bilaterally  . Branchial cleft cyst excision  07/08/1948    cyst left ear  . Cataract extraction  2010    Bilateral  . Colonoscopy    . Polypectomy      Family History  Problem Relation Age of Onset  . Hypertension Father   . Cancer Sister     neck  . Heart disease Neg Hx   . Diabetes Neg Hx   . Prostate cancer Neg Hx   . Colon cancer Neg Hx     History   Social History  . Marital Status: Divorced    Spouse Name: N/A    Number of Children: 1  . Years of Education: N/A   Occupational History  . Retired     as Office manager   Social History Main Topics  . Smoking status: Never Smoker   . Smokeless tobacco: Never Used  . Alcohol Use: No  . Drug Use: No  . Sexual Activity: Not on file   Other Topics Concern  . Not on file   Social History Narrative   Has living will   Requests lady friend, [name], to make health care decisions.   Would accept resuscitation attempts    No feeding tube if cognitively unaware   Review of Systems No rash No vomiting or diarrhea Appetite off some     Objective:   Physical Exam  Constitutional: He appears well-developed and well-nourished. No distress.  HENT:  Mild right maxillary tenderness TMs normal Moderate nasal inflammation Slight pharyngeal injection only  Neck: Normal range of motion. Neck supple. No thyromegaly present.  Slight tenderness with ant cervical palpation  Pulmonary/Chest: Effort normal and breath sounds normal. No respiratory distress.  He has no wheezes. He has no rales.  Lymphadenopathy:    He has no cervical adenopathy.  Skin: No rash noted.          Assessment & Plan:

## 2013-11-11 NOTE — Assessment & Plan Note (Signed)
Clearly has secondary bacterial infection Will try amoxil

## 2013-12-17 ENCOUNTER — Encounter: Payer: Medicare Other | Admitting: Internal Medicine

## 2013-12-28 ENCOUNTER — Encounter: Payer: Medicare Other | Admitting: Internal Medicine

## 2013-12-31 ENCOUNTER — Encounter: Payer: Self-pay | Admitting: Internal Medicine

## 2013-12-31 ENCOUNTER — Ambulatory Visit (INDEPENDENT_AMBULATORY_CARE_PROVIDER_SITE_OTHER): Payer: Commercial Managed Care - HMO | Admitting: Internal Medicine

## 2013-12-31 VITALS — BP 138/70 | HR 57 | Temp 97.8°F | Ht 65.5 in | Wt 187.0 lb

## 2013-12-31 DIAGNOSIS — Z0000 Encounter for general adult medical examination without abnormal findings: Secondary | ICD-10-CM

## 2013-12-31 DIAGNOSIS — M1990 Unspecified osteoarthritis, unspecified site: Secondary | ICD-10-CM

## 2013-12-31 DIAGNOSIS — Z23 Encounter for immunization: Secondary | ICD-10-CM

## 2013-12-31 DIAGNOSIS — R5383 Other fatigue: Secondary | ICD-10-CM

## 2013-12-31 DIAGNOSIS — E538 Deficiency of other specified B group vitamins: Secondary | ICD-10-CM

## 2013-12-31 DIAGNOSIS — R5381 Other malaise: Secondary | ICD-10-CM

## 2013-12-31 LAB — CBC WITH DIFFERENTIAL/PLATELET
BASOS ABS: 0.1 10*3/uL (ref 0.0–0.1)
BASOS PCT: 1 % (ref 0.0–3.0)
Eosinophils Absolute: 0.2 10*3/uL (ref 0.0–0.7)
Eosinophils Relative: 2.7 % (ref 0.0–5.0)
HEMATOCRIT: 36.7 % — AB (ref 39.0–52.0)
HEMOGLOBIN: 12.3 g/dL — AB (ref 13.0–17.0)
LYMPHS ABS: 1.4 10*3/uL (ref 0.7–4.0)
Lymphocytes Relative: 20.4 % (ref 12.0–46.0)
MCHC: 33.5 g/dL (ref 30.0–36.0)
MCV: 96.8 fl (ref 78.0–100.0)
MONOS PCT: 8.3 % (ref 3.0–12.0)
Monocytes Absolute: 0.6 10*3/uL (ref 0.1–1.0)
NEUTROS ABS: 4.7 10*3/uL (ref 1.4–7.7)
Neutrophils Relative %: 67.6 % (ref 43.0–77.0)
Platelets: 254 10*3/uL (ref 150.0–400.0)
RBC: 3.8 Mil/uL — AB (ref 4.22–5.81)
RDW: 14.9 % (ref 11.5–15.5)
WBC: 7 10*3/uL (ref 4.0–10.5)

## 2013-12-31 LAB — T4, FREE: Free T4: 0.93 ng/dL (ref 0.60–1.60)

## 2013-12-31 LAB — COMPREHENSIVE METABOLIC PANEL
ALT: 9 U/L (ref 0–53)
AST: 16 U/L (ref 0–37)
Albumin: 3.8 g/dL (ref 3.5–5.2)
Alkaline Phosphatase: 69 U/L (ref 39–117)
BILIRUBIN TOTAL: 0.9 mg/dL (ref 0.2–1.2)
BUN: 14 mg/dL (ref 6–23)
CO2: 27 mEq/L (ref 19–32)
CREATININE: 1.1 mg/dL (ref 0.4–1.5)
Calcium: 8.5 mg/dL (ref 8.4–10.5)
Chloride: 107 mEq/L (ref 96–112)
GFR: 72.57 mL/min (ref >60.00)
Glucose, Bld: 88 mg/dL (ref 70–99)
Potassium: 3.8 mEq/L (ref 3.5–5.1)
Sodium: 140 mEq/L (ref 135–145)
Total Protein: 7 g/dL (ref 6.0–8.3)

## 2013-12-31 LAB — TSH: TSH: 1.72 u[IU]/mL (ref 0.35–4.50)

## 2013-12-31 LAB — VITAMIN B12: Vitamin B-12: 732 pg/mL (ref 211–911)

## 2013-12-31 MED ORDER — KETOCONAZOLE 2 % EX SHAM: 1.0000 "application " | MEDICATED_SHAMPOO | CUTANEOUS | Status: DC

## 2013-12-31 NOTE — Addendum Note (Signed)
Addended by: Despina Hidden on: 12/31/2013 03:04 PM   Modules accepted: Orders

## 2013-12-31 NOTE — Assessment & Plan Note (Signed)
Vague No worrisome history or PE findings Will just check labs

## 2013-12-31 NOTE — Assessment & Plan Note (Signed)
Mostly knees Some help with glucosamine

## 2013-12-31 NOTE — Progress Notes (Signed)
Pre visit review using our clinic review tool, if applicable. No additional management support is needed unless otherwise documented below in the visit note. 

## 2013-12-31 NOTE — Assessment & Plan Note (Signed)
Healthy No PSA or prostate exam due to age UTD on colon prevnar today

## 2013-12-31 NOTE — Assessment & Plan Note (Signed)
On oral Rx Will recheck level

## 2013-12-31 NOTE — Progress Notes (Signed)
Subjective:    Patient ID: Todd Hodges, male    DOB: 04-10-1941, 73 y.o.   MRN: [id number]  HPI Here for physical Has some fatigue lately Wonders about his blood count--- takes B12 but known mild anemia (was told he was low in past) Some foot pain but no sensation changes  Has knee arthritis Exercises regularly  Still on the glucosamine--tried off and thinks he got some worse, so back on  Current Outpatient Prescriptions on File Prior to Visit  Medication Sig Dispense Refill  . aspirin 81 MG tablet Take 40.5 mg by mouth every other day.       . vitamin B-12 (CYANOCOBALAMIN) 1000 MCG tablet Take 1,000 mcg by mouth daily.         No current facility-administered medications on file prior to visit.    No Known Allergies  Past Medical History  Diagnosis Date  . Hx of adenomatous colonic polyps   . Osteoporosis     knees  . IBS (irritable bowel syndrome)   . Vitamin B12 deficiency     has responded to oral therapy  . Anemia     b12 deficiency  . Arthritis   . Hypertension   . Cataract     had surgery on both eyes    Past Surgical History  Procedure Laterality Date  . Eye surgery  07/08/2008    cataracts bilaterally  . Branchial cleft cyst excision  07/08/1948    cyst left ear  . Cataract extraction  2010    Bilateral  . Colonoscopy    . Polypectomy      Family History  Problem Relation Age of Onset  . Hypertension Father   . Cancer Sister     neck  . Heart disease Neg Hx   . Diabetes Neg Hx   . Prostate cancer Neg Hx   . Colon cancer Neg Hx     History   Social History  . Marital Status: Divorced    Spouse Name: N/A    Number of Children: 1  . Years of Education: N/A   Occupational History  . Retired     as Office manager   Social History Main Topics  . Smoking status: Never Smoker   . Smokeless tobacco: Never Used  . Alcohol Use: No  . Drug Use: No  . Sexual Activity: Not on file   Other Topics Concern  . Not on file    Social History Narrative   Has living will   Requests lady friend, [name], to make health care decisions.   Would accept resuscitation attempts    No feeding tube if cognitively unaware   Review of Systems  Constitutional: Negative for fatigue and unexpected weight change.       Wears seat belt  HENT: Negative for dental problem, hearing loss and tinnitus.        Regular with dentist  Eyes:       Some blurriness in left eye No diplopia  Respiratory: Negative for cough, chest tightness and shortness of breath.   Cardiovascular: Negative for chest pain, palpitations and leg swelling.  Gastrointestinal: Negative for nausea, vomiting, abdominal pain, constipation and blood in stool.       No heartburn  Endocrine: Negative for cold intolerance and heat intolerance.  Genitourinary: Positive for difficulty urinating. Negative for urgency and frequency.       Mild slow stream   No dribbling No sexual problems Nocturia x 2  Musculoskeletal: Positive  for arthralgias. Negative for back pain and joint swelling.  Skin: Negative for rash.       Keeps up with dermatologist (Dr Evorn Gong)  Allergic/Immunologic: Negative for environmental allergies and immunocompromised state.  Neurological: Negative for dizziness, syncope, weakness, light-headedness, numbness and headaches.  Hematological: Positive for adenopathy. Does not bruise/bleed easily.       Intermittent neck glands- nothing persistent  Psychiatric/Behavioral: Negative for sleep disturbance and dysphoric mood. The patient is not nervous/anxious.        Objective:   Physical Exam  Constitutional: He is oriented to person, place, and time. He appears well-developed and well-nourished. No distress.  HENT:  Head: Normocephalic and atraumatic.  Right Ear: External ear normal.  Left Ear: External ear normal.  Mouth/Throat: Oropharynx is clear and moist. No oropharyngeal exudate.  Eyes: Conjunctivae and EOM are normal. Pupils  are equal, round, and reactive to light.  Neck: Normal range of motion. Neck supple. No thyromegaly present.  Cardiovascular: Normal rate, regular rhythm, normal heart sounds and intact distal pulses.  Exam reveals no gallop.   No murmur heard. Pulmonary/Chest: Effort normal. No respiratory distress. He has no wheezes. He has no rales.  Abdominal: Soft. There is no tenderness.  Musculoskeletal: He exhibits no edema and no tenderness.  Lymphadenopathy:    He has no cervical adenopathy.    He has no axillary adenopathy.       Right: No inguinal adenopathy present.       Left: No inguinal adenopathy present.  Neurological: He is alert and oriented to person, place, and time.  Skin: No rash noted. No erythema.  Psychiatric: He has a normal mood and affect. His behavior is normal.          Assessment & Plan:

## 2014-04-22 ENCOUNTER — Other Ambulatory Visit: Payer: Self-pay

## 2015-01-04 ENCOUNTER — Encounter: Payer: Self-pay | Admitting: Internal Medicine

## 2015-01-04 ENCOUNTER — Ambulatory Visit (INDEPENDENT_AMBULATORY_CARE_PROVIDER_SITE_OTHER): Payer: Commercial Managed Care - HMO | Admitting: Internal Medicine

## 2015-01-04 VITALS — BP 120/68 | HR 58 | Temp 98.0°F | Ht 66.0 in | Wt 193.0 lb

## 2015-01-04 DIAGNOSIS — Z0000 Encounter for general adult medical examination without abnormal findings: Secondary | ICD-10-CM | POA: Diagnosis not present

## 2015-01-04 DIAGNOSIS — D649 Anemia, unspecified: Secondary | ICD-10-CM

## 2015-01-04 DIAGNOSIS — M170 Bilateral primary osteoarthritis of knee: Secondary | ICD-10-CM

## 2015-01-04 DIAGNOSIS — E538 Deficiency of other specified B group vitamins: Secondary | ICD-10-CM | POA: Diagnosis not present

## 2015-01-04 LAB — CBC WITH DIFFERENTIAL/PLATELET
Basophils Absolute: 0.1 10*3/uL (ref 0.0–0.1)
Basophils Relative: 1 % (ref 0.0–3.0)
Eosinophils Absolute: 0.2 10*3/uL (ref 0.0–0.7)
Eosinophils Relative: 3.1 % (ref 0.0–5.0)
HCT: 39.3 % (ref 39.0–52.0)
HEMOGLOBIN: 12.9 g/dL — AB (ref 13.0–17.0)
Lymphocytes Relative: 20.5 % (ref 12.0–46.0)
Lymphs Abs: 1.3 10*3/uL (ref 0.7–4.0)
MCHC: 32.8 g/dL (ref 30.0–36.0)
MCV: 98.9 fl (ref 78.0–100.0)
MONO ABS: 0.6 10*3/uL (ref 0.1–1.0)
Monocytes Relative: 8.8 % (ref 3.0–12.0)
NEUTROS ABS: 4.2 10*3/uL (ref 1.4–7.7)
Neutrophils Relative %: 66.6 % (ref 43.0–77.0)
Platelets: 236 10*3/uL (ref 150.0–400.0)
RBC: 3.98 Mil/uL — ABNORMAL LOW (ref 4.22–5.81)
RDW: 15.1 % (ref 11.5–15.5)
WBC: 6.3 10*3/uL (ref 4.0–10.5)

## 2015-01-04 LAB — COMPREHENSIVE METABOLIC PANEL
ALK PHOS: 71 U/L (ref 39–117)
ALT: 8 U/L (ref 0–53)
AST: 16 U/L (ref 0–37)
Albumin: 3.7 g/dL (ref 3.5–5.2)
BUN: 16 mg/dL (ref 6–23)
CHLORIDE: 106 meq/L (ref 96–112)
CO2: 29 mEq/L (ref 19–32)
Calcium: 8.9 mg/dL (ref 8.4–10.5)
Creatinine, Ser: 1.2 mg/dL (ref 0.40–1.50)
GFR: 62.72 mL/min (ref >60.00)
Glucose, Bld: 81 mg/dL (ref 70–99)
Potassium: 3.8 mEq/L (ref 3.5–5.1)
Sodium: 140 mEq/L (ref 135–145)
TOTAL PROTEIN: 6.5 g/dL (ref 6.0–8.3)
Total Bilirubin: 0.6 mg/dL (ref 0.2–1.2)

## 2015-01-04 LAB — VITAMIN B12: VITAMIN B 12: 632 pg/mL (ref 211–911)

## 2015-01-04 LAB — T4, FREE: FREE T4: 1.01 ng/dL (ref 0.60–1.60)

## 2015-01-04 NOTE — Progress Notes (Signed)
Pre visit review using our clinic review tool, if applicable. No additional management support is needed unless otherwise documented below in the visit note. 

## 2015-01-04 NOTE — Assessment & Plan Note (Signed)
No symptoms Will check levels

## 2015-01-04 NOTE — Assessment & Plan Note (Signed)
No longer runs due to this Doesn't need meds though

## 2015-01-04 NOTE — Assessment & Plan Note (Signed)
Very mild Will recheck

## 2015-01-04 NOTE — Progress Notes (Signed)
Subjective:    Patient ID: Todd Hodges, male    DOB: [birth year], 74 y.o.   MRN: [id number]  HPI  Here for Medicare wellness and follow up of chronic medical conditions Reviewed form and advanced directives [facility] No tobacco or [facility] Vision is okay. Mild hearing loss--no change No falls No depression or anhedonia Independent with instrumental ADLs No problems with cognitive function  Still gets some tiredness [facility] 2[facility] times per [facility] Walks dog daily also and stays busy with the house  Continues on the vitamin B12 No problems with this No sensory changes in extremities  Still has mild arthritis problems Knees, feet and ankles mostly Glucosamine seems to help  Current Outpatient Prescriptions on File Prior to Visit  Medication Sig Dispense Refill  . aspirin 81 MG tablet Take 40.5 mg by mouth every other day.     . Calcium Polycarbophil 625 MG CHEW Chew by mouth 2 (two) times daily.    . Glucosamine-Chondroitin 1500-1200 MG/30ML LIQD Take by mouth 2 (two) times daily.    . vitamin B-12 (CYANOCOBALAMIN) 1000 MCG tablet Take 1,000 mcg by mouth daily.       No current facility-administered medications on file prior to visit.    No Known Allergies  Past Medical History  Diagnosis Date  . Hx of adenomatous colonic polyps   . Osteoporosis     knees  . IBS (irritable bowel syndrome)   . Vitamin B12 deficiency     has responded to oral therapy  . Anemia     b12 deficiency  . Arthritis   . Hypertension   . Cataract     had surgery on both eyes    Past Surgical History  Procedure Laterality Date  . Eye surgery  07/08/2008    cataracts bilaterally  . Branchial cleft cyst excision  07/08/1948    cyst left ear  . Cataract extraction  2010    Bilateral  . Colonoscopy    . Polypectomy      Family History  Problem Relation Age of Onset  . Hypertension Father   . Cancer Sister     neck  . Heart disease Neg Hx     . Diabetes Neg Hx   . Prostate cancer Neg Hx   . Colon cancer Neg Hx     History   Social History  . Marital Status: Divorced    Spouse Name: N/A  . Number of Children: 1  . Years of Education: N/A   Occupational History  . Retired     as Office manager   Social History Main Topics  . Smoking status: Never Smoker   . Smokeless tobacco: Never Used  . Alcohol Use: No  . Drug Use: No  . Sexual Activity: Not on file   Other Topics Concern  . Not on file   Social History Narrative   Has living will   [facility] friend, Todd Hodges, is health care POA   Would accept resuscitation attempts    No feeding tube if cognitively unaware   Review of Systems Sleeps okay Nocturia now 1-2 times. No daytime problems Has lump in left posterior neck for a year or so. No pain Teeth okay-- keeps up with dentist Sees Dr Evorn Gong regularly No chest pain No SOB No dizziness or syncope    Objective:   Physical Exam  Constitutional: He is oriented to person, place, and time. He appears well-developed and  well-nourished. No distress.  HENT:  Mouth/Throat: Oropharynx is clear and moist. No oropharyngeal exudate.  Neck: Normal range of motion. Neck supple. No thyromegaly present.  Cardiovascular: Normal rate, regular rhythm, normal heart sounds and intact distal pulses.  Exam reveals no gallop.   No murmur heard. Pulmonary/Chest: Effort normal and breath sounds normal. No respiratory distress. He has no wheezes. He has no rales.  Abdominal: Soft.  Musculoskeletal: He exhibits no edema or tenderness.  Lymphadenopathy:    He has no cervical adenopathy.  Neurological: He is alert and oriented to person, place, and time.  President-- "Elyn Peers, [place], [place]" [phone] D-l-r-o-w Recall 2/3  Skin: No rash noted. No erythema.  Small benign appearing lesion on left neck--with pimple in middle (will show derm)  Psychiatric: He has a normal mood and affect. His behavior is  normal.          Assessment & Plan:

## 2015-01-04 NOTE — Assessment & Plan Note (Signed)
I have personally reviewed the Medicare Annual Wellness questionnaire and have noted 1. The patient's medical and social history 2. Their use of alcohol, tobacco or illicit drugs 3. Their current medications and supplements 4. The patient's functional ability including ADL's, fall risks, home safety risks and hearing or visual             impairment. 5. Diet and physical activities 6. Evidence for depression or mood disorders  The patients weight, height, BMI and visual acuity have been recorded in the chart I have made referrals, counseling and provided education to the patient based review of the above and I have provided the pt with a written personalized care plan for preventive services.  I have provided you with a copy of your personalized plan for preventive services. Please take the time to review along with your updated medication list.  He prefers no flu shots--got sick with them Due for colon next year No more PSAs Discussed increasing exercise

## 2015-01-20 ENCOUNTER — Encounter: Payer: Self-pay | Admitting: Internal Medicine

## 2015-06-22 ENCOUNTER — Telehealth: Payer: Self-pay

## 2015-06-22 DIAGNOSIS — B356 Tinea cruris: Secondary | ICD-10-CM

## 2015-06-22 MED ORDER — HYDROCORTISONE 2.5 % EX CREA: TOPICAL_CREAM | Freq: Two times a day (BID) | CUTANEOUS | Status: DC | PRN

## 2015-06-22 NOTE — Telephone Encounter (Signed)
Patient left a voicemail requesting a refill for Hydrocortisone cream. Last refilled 06/18/13. Ok to refill?

## 2015-06-22 NOTE — Telephone Encounter (Signed)
Yes #60 gm x 1

## 2015-06-22 NOTE — Telephone Encounter (Signed)
rx sent to pharmacy by e-script  

## 2015-06-22 NOTE — Addendum Note (Signed)
Addended by: Despina Hidden on: 06/22/2015 04:21 PM   Modules accepted: Orders

## 2015-07-13 ENCOUNTER — Encounter: Payer: Self-pay | Admitting: Internal Medicine

## 2015-07-26 IMAGING — CR DG HAND 2V*L*
1 series · 2 of 2 positions shown · non-contrast
Comparison: none

REASON FOR EXAM: animal bite - pain, puncture wounds
COMMENTS:

[Series 1: x hand pa left · 0.14mm/px · 2 of 2 slices shown]
[im 1/2]
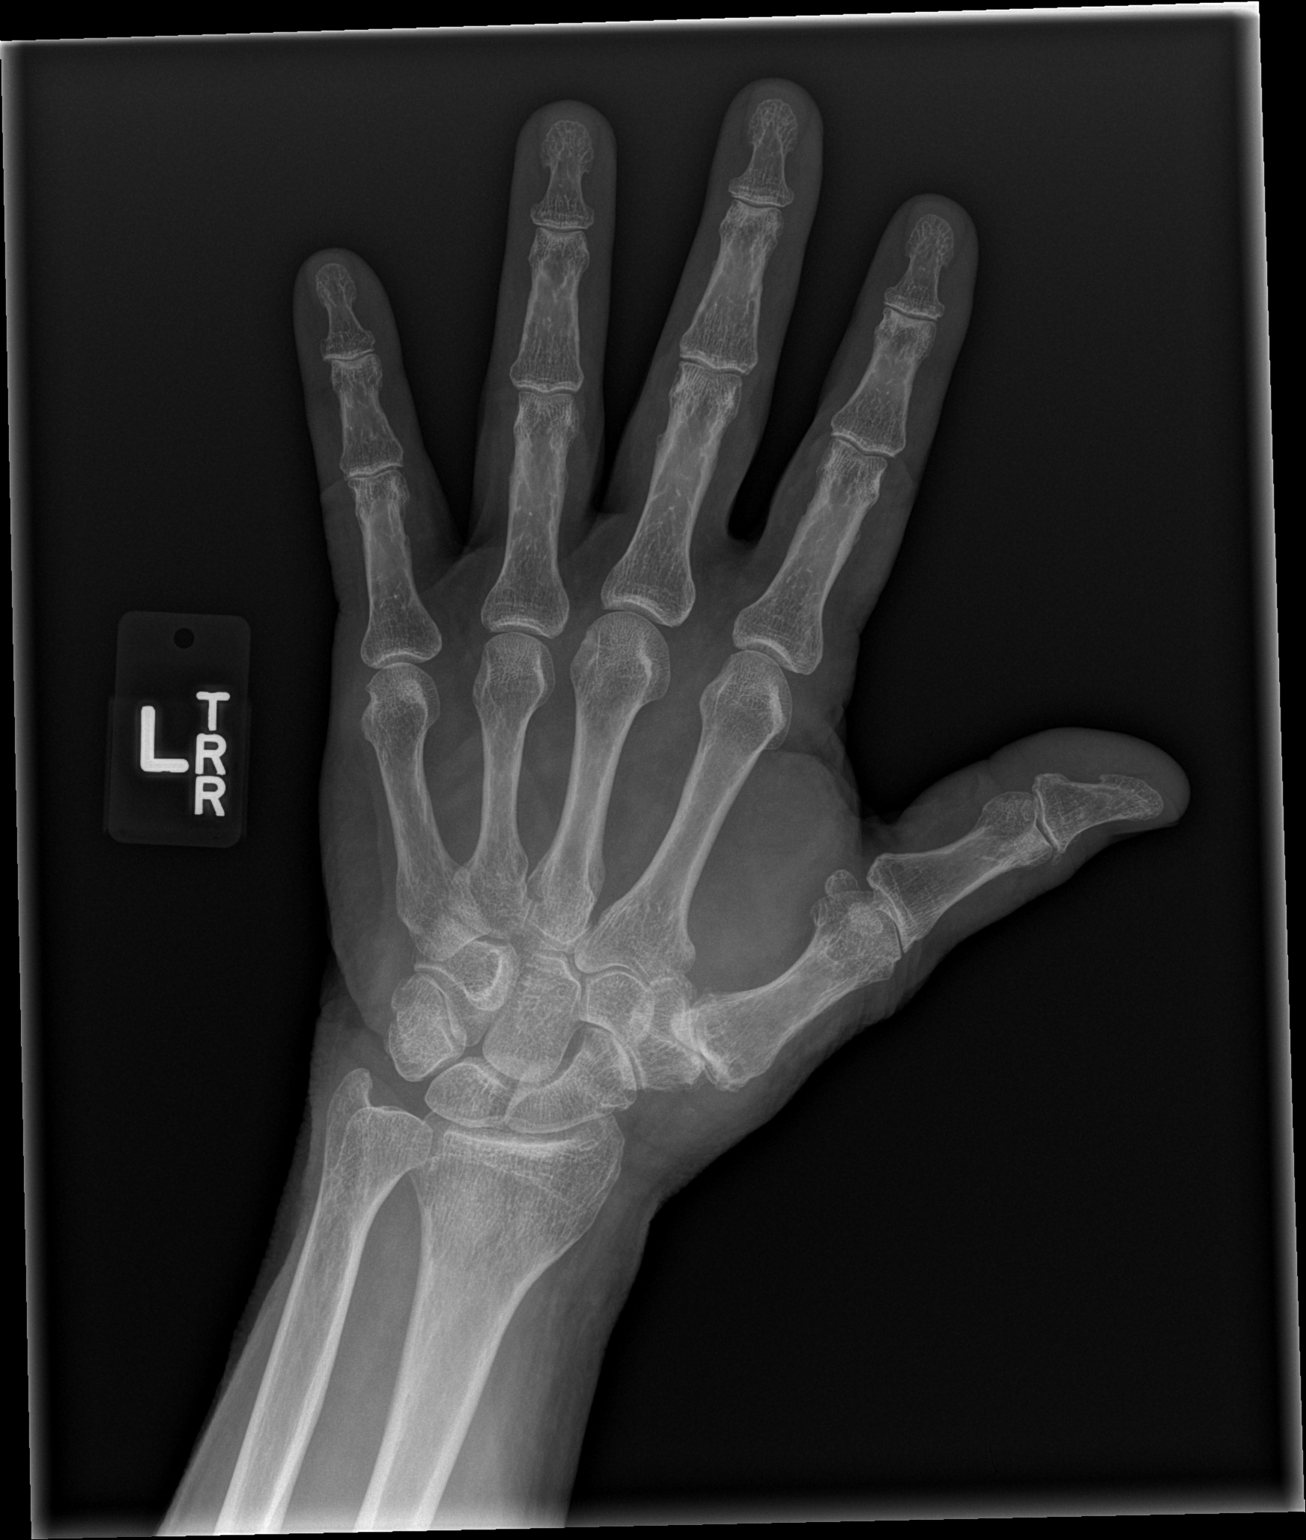
[im 2/2]
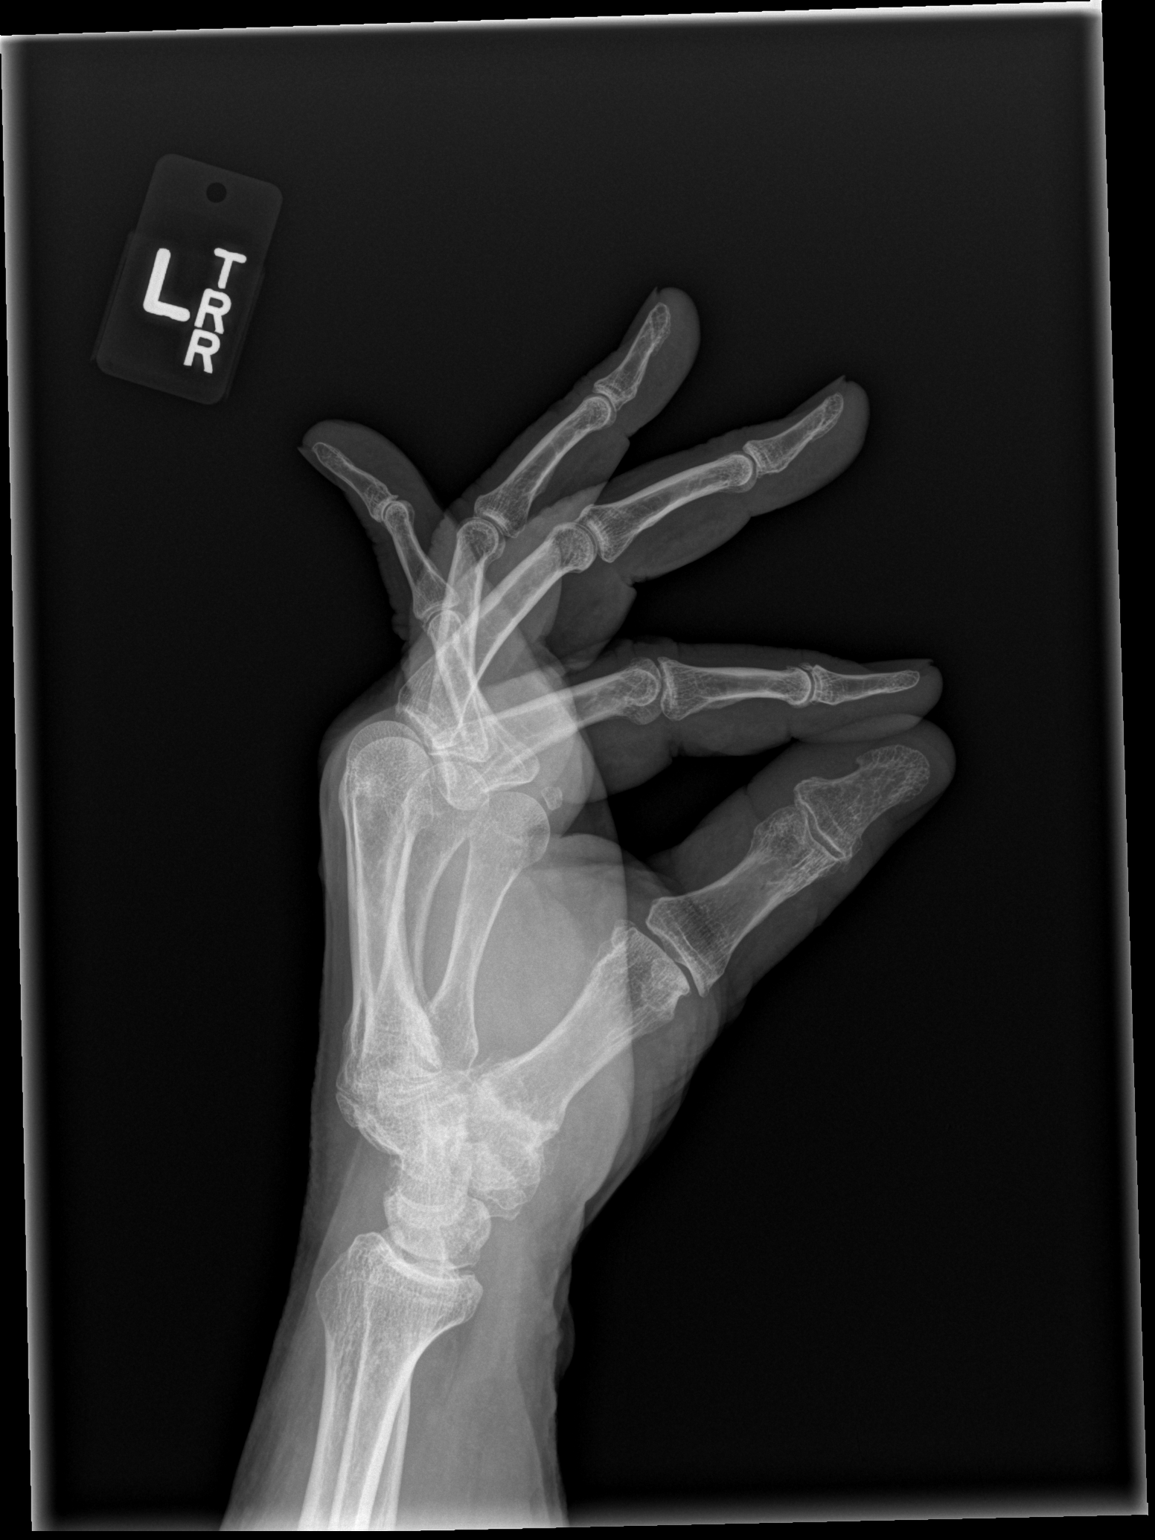

[2 of 2 positions shown; findings below may reference images not displayed]

PROCEDURE:     DXR - DXR HAND LT TWO VIEWS  - March 25, 2013  [DATE]

RESULT:     AP and lateral views of the left hand reveal the bones to be
mildly osteopenic. There is no evidence of an acute fracture or dislocation.
Mild diffuse interphalangeal joint narrowing is present. No radiopaque
foreign bodies are demonstrated and there is no definite soft tissue gas.
IMPRESSION: There is no acute bony abnormality of the left hand.

[REDACTED]

## 2015-09-13 DIAGNOSIS — L821 Other seborrheic keratosis: Secondary | ICD-10-CM | POA: Diagnosis not present

## 2015-09-13 DIAGNOSIS — X32XXXA Exposure to sunlight, initial encounter: Secondary | ICD-10-CM | POA: Diagnosis not present

## 2015-09-13 DIAGNOSIS — Z85828 Personal history of other malignant neoplasm of skin: Secondary | ICD-10-CM | POA: Diagnosis not present

## 2015-09-13 DIAGNOSIS — L570 Actinic keratosis: Secondary | ICD-10-CM | POA: Diagnosis not present

## 2015-11-22 DIAGNOSIS — H26493 Other secondary cataract, bilateral: Secondary | ICD-10-CM | POA: Diagnosis not present

## 2015-12-05 DIAGNOSIS — R69 Illness, unspecified: Secondary | ICD-10-CM | POA: Diagnosis not present

## 2015-12-18 ENCOUNTER — Encounter: Payer: Self-pay | Admitting: Internal Medicine

## 2015-12-20 DIAGNOSIS — R69 Illness, unspecified: Secondary | ICD-10-CM | POA: Diagnosis not present

## 2016-01-08 ENCOUNTER — Encounter: Payer: Commercial Managed Care - HMO | Admitting: Internal Medicine

## 2016-02-15 ENCOUNTER — Ambulatory Visit (INDEPENDENT_AMBULATORY_CARE_PROVIDER_SITE_OTHER): Payer: Medicare HMO | Admitting: Internal Medicine

## 2016-02-15 ENCOUNTER — Encounter: Payer: Self-pay | Admitting: Internal Medicine

## 2016-02-15 VITALS — BP 142/90 | HR 47 | Temp 97.8°F | Ht 65.0 in | Wt 188.0 lb

## 2016-02-15 DIAGNOSIS — Z0000 Encounter for general adult medical examination without abnormal findings: Secondary | ICD-10-CM | POA: Diagnosis not present

## 2016-02-15 DIAGNOSIS — M170 Bilateral primary osteoarthritis of knee: Secondary | ICD-10-CM

## 2016-02-15 DIAGNOSIS — E538 Deficiency of other specified B group vitamins: Secondary | ICD-10-CM | POA: Diagnosis not present

## 2016-02-15 NOTE — Progress Notes (Signed)
Subjective:    Patient ID: Todd Hodges, male    DOB: [birth year], 75 y.o.   MRN: [id number]  HPI Here for Medicare wellness visit and follow up of chronic health conditions Reviewed form and advanced directives Reviewed other doctors No depression or anhedonia No falls No vision problems. Hearing is mildly off. Not really exercising much--discussed Independent with instrumental ADLs No major cognitive problems--very mild memory issues (like names)  Still concerned about his daughter Has a job now that she likes and has insurance Still on Rx for depression but doing better  Not much knee pain Stopped running some time ago Just taking glucosamine chondroitin Does exercise on bicycle Discussed doing resistance exercise  On double blind study ---cocoa and multivitamin ?cancer/cognitive/heart outcomes being looked at  Current Outpatient Prescriptions on File Prior to Visit  Medication Sig Dispense Refill  . aspirin 81 MG tablet Take 81 mg by mouth every other day.     . Calcium Polycarbophil 625 MG CHEW Chew by mouth 2 (two) times daily.    . hydrocortisone 2.5 % cream Apply topically 2 (two) times daily as needed. For itching 60 g 1  . vitamin B-12 (CYANOCOBALAMIN) 1000 MCG tablet Take 1,000 mcg by mouth daily.       No current facility-administered medications on file prior to visit.     No Known Allergies  Past Medical History:  Diagnosis Date  . Anemia    b12 deficiency  . Arthritis   . Cataract    had surgery on both eyes  . Hx of adenomatous colonic polyps   . Hypertension   . IBS (irritable bowel syndrome)   . Osteoporosis    knees  . Vitamin B12 deficiency    has responded to oral therapy    Past Surgical History:  Procedure Laterality Date  . 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 CATARACT EXTRACTION  2010   Bilateral  . COLONOSCOPY    . EYE SURGERY  07/08/2008   cataracts bilaterally  . POLYPECTOMY      Family  History  Problem Relation Age of Onset  . Hypertension Father   . Cancer Sister     neck  . Heart disease Neg Hx   . Diabetes Neg Hx   . Prostate cancer Neg Hx   . Colon cancer Neg Hx     Social History   Social History  . Marital status: Divorced    Spouse name: N/A  . Number of children: 1  . Years of education: N/A   Occupational History  . Retired     as Office manager   Social History Main Topics  . Smoking status: Never Smoker  . Smokeless tobacco: Never Used  . Alcohol use No  . Drug use: No  . Sexual activity: Not on file   Other Topics Concern  . Not on file   Social History Narrative   Has living will   [facility] friend, [name], is health care POA   Would accept resuscitation attempts    No feeding tube if cognitively unaware   Review of Systems Sleeps well Appetite is good Weight is down slightly Wears seat belt Teeth fine-- keeps up with dentist Has lesion in scalp he wants checked. Does see Dr Evorn Gong Bowels are fine--no blood. Gets constipated when he travels--discussed miralax Voids okay. No chest pain or palpitations No SOB No dizziness or syncope     Objective:   Physical  Exam  Constitutional: He is oriented to person, place, and time. He appears well-developed and well-nourished. No distress.  HENT:  Mouth/Throat: Oropharynx is clear and moist. No oropharyngeal exudate.  Neck: Normal range of motion. Neck supple. No thyromegaly present.  Cardiovascular: Normal rate, regular rhythm, normal heart sounds and intact distal pulses.  Exam reveals no gallop.   No murmur heard. Pulmonary/Chest: Effort normal and breath sounds normal. No respiratory distress. He has no wheezes. He has no rales.  Abdominal: Soft. There is no tenderness.  Musculoskeletal: He exhibits no edema or tenderness.  Lymphadenopathy:    He has no cervical adenopathy.  Neurological: He is alert and oriented to person, place, and time.  President--- "Gerarda Fraction  Trump, Obama, [place]" [phone] D-l-r-o-w Recall 3/3  Skin: No rash noted. No erythema.  Psychiatric: He has a normal mood and affect. His behavior is normal.          Assessment & Plan:

## 2016-02-15 NOTE — Progress Notes (Signed)
Pre visit review using our clinic review tool, if applicable. No additional management support is needed unless otherwise documented below in the visit note. 

## 2016-02-15 NOTE — Assessment & Plan Note (Signed)
No symptoms Will check level

## 2016-02-15 NOTE — Assessment & Plan Note (Signed)
Doing okay No pain meds--just glucosamine

## 2016-02-15 NOTE — Assessment & Plan Note (Signed)
I have personally reviewed the Medicare Annual Wellness questionnaire and have noted 1. The patient's medical and social history 2. Their use of alcohol, tobacco or illicit drugs 3. Their current medications and supplements 4. The patient's functional ability including ADL's, fall risks, home safety risks and hearing or visual             impairment. 5. Diet and physical activities 6. Evidence for depression or mood disorders  The patients weight, height, BMI and visual acuity have been recorded in the chart I have made referrals, counseling and provided education to the patient based review of the above and I have provided the pt with a written personalized care plan for preventive services.  I have provided you with a copy of your personalized plan for preventive services. Please take the time to review along with your updated medication list.  Prefers no flu shot Will get one last colonoscopy later this year No PSA due to age Discussed exercise

## 2016-02-15 NOTE — Patient Instructions (Signed)
You can try miralax (polyethylene glycol) daily (1 capful) for when you travel.

## 2016-02-16 LAB — CBC WITH DIFFERENTIAL/PLATELET
BASOS PCT: 0.5 % (ref 0.0–3.0)
Basophils Absolute: 0 10*3/uL (ref 0.0–0.1)
EOS ABS: 0.2 10*3/uL (ref 0.0–0.7)
Eosinophils Relative: 2.6 % (ref 0.0–5.0)
HEMATOCRIT: 39.7 % (ref 39.0–52.0)
HEMOGLOBIN: 13.3 g/dL (ref 13.0–17.0)
Lymphocytes Relative: 24.3 % (ref 12.0–46.0)
Lymphs Abs: 1.7 10*3/uL (ref 0.7–4.0)
MCHC: 33.4 g/dL (ref 30.0–36.0)
MCV: 98.5 fl (ref 78.0–100.0)
MONO ABS: 0.5 10*3/uL (ref 0.1–1.0)
Monocytes Relative: 7.2 % (ref 3.0–12.0)
NEUTROS ABS: 4.5 10*3/uL (ref 1.4–7.7)
Neutrophils Relative %: 65.4 % (ref 43.0–77.0)
Platelets: 244 10*3/uL (ref 150.0–400.0)
RBC: 4.03 Mil/uL — ABNORMAL LOW (ref 4.22–5.81)
RDW: 15 % (ref 11.5–15.5)
WBC: 6.9 10*3/uL (ref 4.0–10.5)

## 2016-02-16 LAB — COMPREHENSIVE METABOLIC PANEL
ALT: 9 U/L (ref 0–53)
AST: 16 U/L (ref 0–37)
Albumin: 4.1 g/dL (ref 3.5–5.2)
Alkaline Phosphatase: 69 U/L (ref 39–117)
BUN: 15 mg/dL (ref 6–23)
CHLORIDE: 103 meq/L (ref 96–112)
CO2: 29 meq/L (ref 19–32)
CREATININE: 1.11 mg/dL (ref 0.40–1.50)
Calcium: 9 mg/dL (ref 8.4–10.5)
GFR: 68.42 mL/min (ref >60.00)
GLUCOSE: 85 mg/dL (ref 70–99)
Potassium: 3.7 mEq/L (ref 3.5–5.1)
Sodium: 138 mEq/L (ref 135–145)
TOTAL PROTEIN: 6.9 g/dL (ref 6.0–8.3)
Total Bilirubin: 0.7 mg/dL (ref 0.2–1.2)

## 2016-02-16 LAB — VITAMIN B12: VITAMIN B 12: 1034 pg/mL — AB (ref 211–911)

## 2016-02-21 DIAGNOSIS — Z0000 Encounter for general adult medical examination without abnormal findings: Secondary | ICD-10-CM | POA: Diagnosis not present

## 2016-02-21 DIAGNOSIS — Z6831 Body mass index (BMI) 31.0-31.9, adult: Secondary | ICD-10-CM | POA: Diagnosis not present

## 2016-04-12 ENCOUNTER — Encounter: Payer: Self-pay | Admitting: Internal Medicine

## 2016-05-15 DIAGNOSIS — L821 Other seborrheic keratosis: Secondary | ICD-10-CM | POA: Diagnosis not present

## 2016-05-15 DIAGNOSIS — Z85828 Personal history of other malignant neoplasm of skin: Secondary | ICD-10-CM | POA: Diagnosis not present

## 2016-05-15 DIAGNOSIS — Z08 Encounter for follow-up examination after completed treatment for malignant neoplasm: Secondary | ICD-10-CM | POA: Diagnosis not present

## 2016-05-15 DIAGNOSIS — L570 Actinic keratosis: Secondary | ICD-10-CM | POA: Diagnosis not present

## 2016-05-15 DIAGNOSIS — X32XXXA Exposure to sunlight, initial encounter: Secondary | ICD-10-CM | POA: Diagnosis not present

## 2016-05-29 DIAGNOSIS — R69 Illness, unspecified: Secondary | ICD-10-CM | POA: Diagnosis not present

## 2016-06-07 HISTORY — PX: COLONOSCOPY: SHX174

## 2016-06-10 ENCOUNTER — Ambulatory Visit (AMBULATORY_SURGERY_CENTER): Payer: Self-pay

## 2016-06-10 VITALS — Ht 66.0 in | Wt 188.6 lb

## 2016-06-10 DIAGNOSIS — Z860100 Personal history of colon polyps, unspecified: Secondary | ICD-10-CM

## 2016-06-10 DIAGNOSIS — I10 Essential (primary) hypertension: Secondary | ICD-10-CM | POA: Insufficient documentation

## 2016-06-10 MED ORDER — SUPREP BOWEL PREP KIT 17.5-3.13-1.6 GM/177ML PO SOLN: 1.0000 | Freq: Once | ORAL | 0 refills | Status: AC

## 2016-06-10 NOTE — Progress Notes (Signed)
No allergies to eggs or soy No diet meds No home oxygen No problems with anesthesia  Has IBS symptoms, constipation type, taking Miralax.  Please bring to JP's attention if pt forgets to mention it  Declined emmi

## 2016-06-24 ENCOUNTER — Ambulatory Visit (AMBULATORY_SURGERY_CENTER): Payer: Medicare HMO | Admitting: Internal Medicine

## 2016-06-24 ENCOUNTER — Encounter: Payer: Self-pay | Admitting: Internal Medicine

## 2016-06-24 VITALS — BP 129/66 | HR 51 | Temp 97.5°F | Resp 12 | Ht 66.0 in | Wt 188.0 lb

## 2016-06-24 DIAGNOSIS — D124 Benign neoplasm of descending colon: Secondary | ICD-10-CM

## 2016-06-24 DIAGNOSIS — I10 Essential (primary) hypertension: Secondary | ICD-10-CM | POA: Diagnosis not present

## 2016-06-24 DIAGNOSIS — D123 Benign neoplasm of transverse colon: Secondary | ICD-10-CM

## 2016-06-24 DIAGNOSIS — Z86010 Personal history of colonic polyps: Secondary | ICD-10-CM | POA: Diagnosis not present

## 2016-06-24 NOTE — Op Note (Signed)
[place] Patient Name: Todd Hodges Procedure Date: 06/24/2016 8:01 AM MRN: [id number] Endoscopist: Docia Chuck. Henrene Pastor , MD Age: 75 Referring MD:  Date of Birth: [birth year] Gender: Male Account #: [id number] Procedure:                Colonoscopy w/ cold snare x 2 Indications:              High risk colon cancer surveillance: Personal                            history of non-advanced adenoma. 2006 (TA); 2012                            (HP) Medicines:                Monitored Anesthesia Care Procedure:                Pre-Anesthesia Assessment:                           - Prior to the procedure, a History and Physical                            was performed, and patient medications and                            allergies were reviewed. The patient's tolerance of                            previous anesthesia was also reviewed. The risks                            and benefits of the procedure and the sedation                            options and risks were discussed with the patient.                            All questions were answered, and informed consent                            was obtained. Prior Anticoagulants: The patient has                            taken no previous anticoagulant or antiplatelet                            agents. ASA Grade Assessment: II - A patient with                            mild systemic disease. After reviewing the risks                            and benefits, the patient was deemed in  satisfactory condition to undergo the procedure.                           After obtaining informed consent, the colonoscope                            was passed under direct vision. Throughout the                            procedure, the patient's blood pressure, pulse, and                            oxygen saturations were monitored continuously. The                            Model CF-HQ190L [id number]) scope was  introduced                            through the anus and advanced to the the cecum,                            identified by appendiceal orifice and ileocecal                            valve. The ileocecal valve, appendiceal orifice,                            and rectum were photographed. The quality of the                            bowel preparation was excellent. The colonoscopy                            was performed without difficulty. The patient                            tolerated the procedure well. The bowel preparation                            used was SUPREP. Scope In: 8:19:08 AM Scope Out: 8:34:32 AM Scope Withdrawal Time: 0 hours 11 minutes 58 seconds  Total Procedure Duration: 0 hours 15 minutes 24 seconds  Findings:                 Two polyps were found in the descending colon and                            transverse colon. The polyps were 2 to 4 mm in                            size. These polyps were removed with a cold snare.                            Resection and retrieval were complete.  Multiple diverticula were found in the sigmoid                            colon.                           Internal hemorrhoids were found during retroflexion.                           The exam was otherwise without abnormality on                            direct and retroflexion views. Complications:            No immediate complications. Estimated blood loss:                            None. Estimated Blood Loss:     Estimated blood loss: none. Impression:               - Two 2 to 4 mm polyps in the descending colon and                            in the transverse colon, removed with a cold snare.                            Resected and retrieved.                           - Diverticulosis in the sigmoid colon.                           - Internal hemorrhoids.                           - The examination was otherwise normal on direct                             and retroflexion views. Recommendation:           - Repeat colonoscopy in 5 years if polyps                            adenomatous and patient fit and willing. Otherwise                            prn.                           - Patient has a contact number available for                            emergencies. The signs and symptoms of potential                            delayed complications were discussed with the  patient. Return to normal activities tomorrow.                            Written discharge instructions were provided to the                            patient.                           - Resume previous diet.                           - Continue present medications.                           - Await pathology results. Docia Chuck. Henrene Pastor, MD 06/24/2016 8:41:30 AM This report has been signed electronically.

## 2016-06-24 NOTE — Progress Notes (Signed)
Called to room to assist during endoscopic procedure.  Patient ID and intended procedure confirmed with present staff. Received instructions for my participation in the procedure from the performing physician.  

## 2016-06-24 NOTE — Progress Notes (Signed)
Report given to PACU RN, vss 

## 2016-06-24 NOTE — Progress Notes (Signed)
HR 39 to 42, robinul 0.2 given  IV before procedure.

## 2016-06-24 NOTE — Patient Instructions (Signed)
Discharge instructions given. Handouts on polyps,diverticulosis and hemorrhoids. Resume previous medications. YOU HAD AN ENDOSCOPIC PROCEDURE TODAY AT THE [facility]:   Refer to the procedure report that was given to you for any specific questions about what was found during the examination.  If the procedure report does not answer your questions, please call your gastroenterologist to clarify.  If you requested that your care partner not be given the details of your procedure findings, then the procedure report has been included in a sealed envelope for you to review at your convenience later.  YOU SHOULD EXPECT: Some feelings of bloating in the abdomen. Passage of more gas than usual.  Walking can help get rid of the air that was put into your GI tract during the procedure and reduce the bloating. If you had a lower endoscopy (such as a colonoscopy or flexible sigmoidoscopy) you may notice spotting of blood in your stool or on the toilet paper. If you underwent a bowel prep for your procedure, you may not have a normal bowel movement for a few days.  Please Note:  You might notice some irritation and congestion in your nose or some drainage.  This is from the oxygen used during your procedure.  There is no need for concern and it should clear up in a day or so.  SYMPTOMS TO REPORT IMMEDIATELY:   Following lower endoscopy (colonoscopy or flexible sigmoidoscopy):  Excessive amounts of blood in the stool  Significant tenderness or worsening of abdominal pains  Swelling of the abdomen that is new, acute  Fever of 100F or higher   For urgent or emergent issues, a gastroenterologist can be reached at any hour by calling [phone].   DIET:  We do recommend a small meal at first, but then you may proceed to your regular diet.  Drink plenty of fluids but you should avoid alcoholic beverages for 24 hours.  ACTIVITY:  You should plan to take it easy for the rest of today and you  should NOT DRIVE or use heavy machinery until tomorrow (because of the sedation medicines used during the test).    FOLLOW UP: Our staff will call the number listed on your records the next business day following your procedure to check on you and address any questions or concerns that you may have regarding the information given to you following your procedure. If we do not reach you, we will leave a message.  However, if you are feeling well and you are not experiencing any problems, there is no need to return our call.  We will assume that you have returned to your regular daily activities without incident.  If any biopsies were taken you will be contacted by phone or by letter within the next 1-3 weeks.  Please call us at [phone] if you have not heard about the biopsies in 3 weeks.    SIGNATURES/CONFIDENTIALITY: You and/or your care partner have signed paperwork which will be entered into your electronic medical record.  These signatures attest to the fact that that the information above on your After Visit Summary has been reviewed and is understood.  Full responsibility of the confidentiality of this discharge information lies with you and/or your care-partner. 

## 2016-06-25 ENCOUNTER — Telehealth: Payer: Self-pay | Admitting: *Deleted

## 2016-06-25 ENCOUNTER — Telehealth: Payer: Self-pay

## 2016-06-25 NOTE — Telephone Encounter (Signed)
  Follow up Call-  Call back number 06/24/2016  Post procedure Call Back phone  # [phone]  Permission to leave phone message Yes  Some recent data might be hidden     Patient questions:  Do you have a fever, pain , or abdominal swelling? No. Pain Score  0 *  Have you tolerated food without any problems? Yes.    Have you been able to return to your normal activities? Yes.    Do you have any questions about your discharge instructions: Diet   No. Medications  No. Follow up visit  No.  Do you have questions or concerns about your Care? No.  Actions: * If pain score is 4 or above: No action needed, pain <4.

## 2016-06-25 NOTE — Telephone Encounter (Signed)
  Follow up Call-  Call back number 06/24/2016  Post procedure Call Back phone  # [phone]  Permission to leave phone message Yes  Some recent data might be hidden     Patient questions:  Message left to call us if necessary.

## 2016-06-28 ENCOUNTER — Encounter: Payer: Self-pay | Admitting: Internal Medicine

## 2016-09-30 DIAGNOSIS — Z9849 Cataract extraction status, unspecified eye: Secondary | ICD-10-CM | POA: Diagnosis not present

## 2016-09-30 DIAGNOSIS — Z0000 Encounter for general adult medical examination without abnormal findings: Secondary | ICD-10-CM | POA: Diagnosis not present

## 2016-09-30 DIAGNOSIS — R030 Elevated blood-pressure reading, without diagnosis of hypertension: Secondary | ICD-10-CM | POA: Diagnosis not present

## 2016-09-30 DIAGNOSIS — E669 Obesity, unspecified: Secondary | ICD-10-CM | POA: Diagnosis not present

## 2016-09-30 DIAGNOSIS — Z6831 Body mass index (BMI) 31.0-31.9, adult: Secondary | ICD-10-CM | POA: Diagnosis not present

## 2016-09-30 DIAGNOSIS — K5900 Constipation, unspecified: Secondary | ICD-10-CM | POA: Diagnosis not present

## 2016-09-30 DIAGNOSIS — Z961 Presence of intraocular lens: Secondary | ICD-10-CM | POA: Diagnosis not present

## 2016-09-30 DIAGNOSIS — Z7982 Long term (current) use of aspirin: Secondary | ICD-10-CM | POA: Diagnosis not present

## 2016-12-23 DIAGNOSIS — R69 Illness, unspecified: Secondary | ICD-10-CM | POA: Diagnosis not present

## 2017-01-22 DIAGNOSIS — L821 Other seborrheic keratosis: Secondary | ICD-10-CM | POA: Diagnosis not present

## 2017-01-22 DIAGNOSIS — L570 Actinic keratosis: Secondary | ICD-10-CM | POA: Diagnosis not present

## 2017-01-22 DIAGNOSIS — L820 Inflamed seborrheic keratosis: Secondary | ICD-10-CM | POA: Diagnosis not present

## 2017-01-22 DIAGNOSIS — B351 Tinea unguium: Secondary | ICD-10-CM | POA: Diagnosis not present

## 2017-01-22 DIAGNOSIS — D485 Neoplasm of uncertain behavior of skin: Secondary | ICD-10-CM | POA: Diagnosis not present

## 2017-02-21 ENCOUNTER — Encounter: Payer: Medicare HMO | Admitting: Internal Medicine

## 2017-02-28 ENCOUNTER — Encounter: Payer: Self-pay | Admitting: Internal Medicine

## 2017-02-28 ENCOUNTER — Ambulatory Visit (INDEPENDENT_AMBULATORY_CARE_PROVIDER_SITE_OTHER): Payer: Medicare HMO | Admitting: Internal Medicine

## 2017-02-28 VITALS — BP 136/84 | HR 46 | Temp 97.6°F | Ht 65.25 in | Wt 183.5 lb

## 2017-02-28 DIAGNOSIS — Z0000 Encounter for general adult medical examination without abnormal findings: Secondary | ICD-10-CM

## 2017-02-28 DIAGNOSIS — B356 Tinea cruris: Secondary | ICD-10-CM | POA: Diagnosis not present

## 2017-02-28 DIAGNOSIS — Z7189 Other specified counseling: Secondary | ICD-10-CM

## 2017-02-28 DIAGNOSIS — Z23 Encounter for immunization: Secondary | ICD-10-CM

## 2017-02-28 DIAGNOSIS — I10 Essential (primary) hypertension: Secondary | ICD-10-CM | POA: Diagnosis not present

## 2017-02-28 DIAGNOSIS — M170 Bilateral primary osteoarthritis of knee: Secondary | ICD-10-CM | POA: Diagnosis not present

## 2017-02-28 MED ORDER — HYDROCORTISONE 2.5 % EX CREA: TOPICAL_CREAM | Freq: Two times a day (BID) | CUTANEOUS | 1 refills | Status: AC | PRN

## 2017-02-28 NOTE — Assessment & Plan Note (Signed)
BP Readings from Last 3 Encounters:  02/28/17 136/84  06/24/16 129/66  02/15/16 (!) 142/90   Better now without Rx

## 2017-02-28 NOTE — Assessment & Plan Note (Signed)
Mild Does okay with just the glucosamine

## 2017-02-28 NOTE — Assessment & Plan Note (Signed)
See social history 

## 2017-02-28 NOTE — Progress Notes (Signed)
Subjective:    Patient ID: Todd Hodges, male    DOB: 1940/08/19, 76 y.o.   MRN: [id number]  HPI Here for Medicare wellness visit and follow up of chronic health conditions Reviewed form and advanced directives Reviewed other doctors No alcohol or tobacco Exercising more regularly now No falls Vision and hearing are okay No apparent memory issues Independent with instrumental ADLs No depression or anhedonia  Still on study-- 2.5 years to go Cocoa extract and vitamin--- 4 way study Cognitive outcomes?? He is not sure though  Mild urinary problems--especially at night Daytime flow is fine  Knee arthritis persists No worse Takes the glucosamine--doesn't use other analgesics Not limiting  Current Outpatient Prescriptions on File Prior to Visit  Medication Sig Dispense Refill  . aspirin 81 MG tablet Take 81 mg by mouth every other day.     . Calcium Polycarbophil 625 MG CHEW Chew by mouth daily.     Marland Kitchen FIBER COMPLETE TABS Take by mouth.    . fluorouracil (EFUDEX) 5 % cream Apply 1 Container topically as needed.    . Investigational - Study Medication Additional Study Details: [facility] CLINICAL TRIAL    . vitamin B-12 (CYANOCOBALAMIN) 1000 MCG tablet Take 1,000 mcg by mouth daily.       No current facility-administered medications on file prior to visit.     No Known Allergies  Past Medical History:  Diagnosis Date  . Anemia    b12 deficiency  . Arthritis   . Cataract    had surgery on both eyes  . Hx of adenomatous colonic polyps   . Hypertension   . IBS (irritable bowel syndrome)   . Osteoporosis    knees  . Vitamin B12 deficiency    has responded to oral therapy    Past Surgical History:  Procedure Laterality Date  . 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 CATARACT EXTRACTION  2010   Bilateral  . COLONOSCOPY    . EYE SURGERY  07/08/2008   cataracts bilaterally  . POLYPECTOMY      Family History  Problem Relation Age of  Onset  . Hypertension Father   . Cancer Sister        neck  . Heart disease Neg Hx   . Diabetes Neg Hx   . Prostate cancer Neg Hx   . Colon cancer Neg Hx     Social History   Social History  . Marital status: Divorced    Spouse name: N/A  . Number of children: 1  . Years of education: N/A   Occupational History  . Retired     as Office manager   Social History Main Topics  . Smoking status: Never Smoker  . Smokeless tobacco: Never Used  . Alcohol use No  . Drug use: No  . Sexual activity: Not on file   Other Topics Concern  . Not on file   Social History Narrative   Has living will   [facility] friend, Todd Hodges, is health care POA   Would accept resuscitation attempts    No feeding tube if cognitively unaware   Review of Systems Did have a colonoscopy--no adenomatous polyps.  Does have some constipation --miralax helps Appetite is fine Lost a few pounds with exercise program Usually sleeps well Wears seat belt Teeth are fine--regular with dentist Keeps up with dermatologist-- used fluorouracil last year (but got new Rx this year that was only for 1 week)  Objective:   Physical Exam  Constitutional: He is oriented to person, place, and time. He appears well-nourished. No distress.  HENT:  Mouth/Throat: Oropharynx is clear and moist. No oropharyngeal exudate.  Neck: No thyromegaly present.  Cardiovascular: Regular rhythm, normal heart sounds and intact distal pulses.  Exam reveals no gallop.   No murmur heard. Mild bradycardia  Pulmonary/Chest: Effort normal and breath sounds normal. No respiratory distress. He has no wheezes. He has no rales.  Abdominal: Soft. There is no tenderness.  Musculoskeletal: He exhibits no edema or tenderness.  Lymphadenopathy:    He has no cervical adenopathy.  Neurological: He is alert and oriented to person, place, and time.  President-- "Todd Hodges, Obama, Bush" [phone] [id number] Recall 3/3    Skin: No rash noted. No erythema.  Psychiatric: He has a normal mood and affect. His behavior is normal.          Assessment & Plan:

## 2017-02-28 NOTE — Assessment & Plan Note (Signed)
I have personally reviewed the Medicare Annual Wellness questionnaire and have noted 1. The patient's medical and social history 2. Their use of alcohol, tobacco or illicit drugs 3. Their current medications and supplements 4. The patient's functional ability including ADL's, fall risks, home safety risks and hearing or visual             impairment. 5. Diet and physical activities 6. Evidence for depression or mood disorders  The patients weight, height, BMI and visual acuity have been recorded in the chart I have made referrals, counseling and provided education to the patient based review of the above and I have provided the pt with a written personalized care plan for preventive services.  I have provided you with a copy of your personalized plan for preventive services. Please take the time to review along with your updated medication list.  Healthy Exercising more Will update pneumovax today He prefers no flu vaccine Done with colons--benign last year No PSA due to age

## 2017-02-28 NOTE — Addendum Note (Signed)
Addended by: Pilar Grammes on: 02/28/2017 09:37 AM   Modules accepted: Orders

## 2017-03-12 DIAGNOSIS — H26491 Other secondary cataract, right eye: Secondary | ICD-10-CM | POA: Diagnosis not present

## 2017-03-27 DIAGNOSIS — H26491 Other secondary cataract, right eye: Secondary | ICD-10-CM | POA: Diagnosis not present

## 2017-04-04 ENCOUNTER — Encounter: Payer: Self-pay | Admitting: Internal Medicine

## 2017-04-04 ENCOUNTER — Ambulatory Visit (INDEPENDENT_AMBULATORY_CARE_PROVIDER_SITE_OTHER): Payer: Medicare HMO | Admitting: Internal Medicine

## 2017-04-04 VITALS — BP 130/84 | HR 46 | Temp 98.2°F | Wt 185.0 lb

## 2017-04-04 DIAGNOSIS — K4090 Unilateral inguinal hernia, without obstruction or gangrene, not specified as recurrent: Secondary | ICD-10-CM

## 2017-04-04 NOTE — Assessment & Plan Note (Signed)
Mild Not incarcerated Hard to tell if this is new (probably) Pain is better today May have just ligament strain as the pain he feels now If he worsens, would set up with surgeon

## 2017-04-04 NOTE — Progress Notes (Signed)
Subjective:    Patient ID: Todd Hodges, male    DOB: [birth year], 76 y.o.   MRN: [id number]  HPI Here due to right groin pain  He thinks he strained it in attic --- had to kneel down on boards he has put down Had to be in strange positions due to cramped quarters This happened 3 weeks ago--but delayed symptoms [facility] --then worsened No known injury there (stationary bike and some machines)---- but has had to limit since then  Good first thing in AM--then worsens after a couple of hours Bulge in RLQ for a while--seems gone now No bulge in groin Comes and goes with pressure in abdomen---worse after big meal or with GI gas Some slight right scrotal fullness  No Rx --but hot tub seems to help  Current Outpatient Prescriptions on File Prior to Visit  Medication Sig Dispense Refill  . aspirin 81 MG tablet Take 81 mg by mouth every other day.     . Calcium Polycarbophil 625 MG CHEW Chew by mouth daily.     Marland Kitchen FIBER COMPLETE TABS Take by mouth.    . fluorouracil (EFUDEX) 5 % cream Apply 1 Container topically as needed.    Marland Kitchen GLUCOSAMINE CHONDROITIN COMPLX PO Take 2 tablets by mouth daily.    . hydrocortisone 2.5 % cream Apply topically 2 (two) times daily as needed. For itching 60 g 1  . Investigational - Study Medication Additional Study Details: [facility] CLINICAL TRIAL    . vitamin B-12 (CYANOCOBALAMIN) 1000 MCG tablet Take 1,000 mcg by mouth daily.       No current facility-administered medications on file prior to visit.     No Known Allergies  Past Medical History:  Diagnosis Date  . Anemia    b12 deficiency  . Arthritis   . Cataract    had surgery on both eyes  . Hx of adenomatous colonic polyps   . Hypertension   . IBS (irritable bowel syndrome)   . Osteoporosis    knees  . Vitamin B12 deficiency    has responded to oral therapy    Past Surgical History:  Procedure Laterality Date  . BRANCHIAL CLEFT CYST EXCISION  07/08/1948   cyst left  ear  . CATARACT EXTRACTION  2010   Bilateral  . COLONOSCOPY    . EYE SURGERY  07/08/2008   cataracts bilaterally  . POLYPECTOMY      Family History  Problem Relation Age of Onset  . Hypertension Father   . Cancer Sister        neck  . Heart disease Neg Hx   . Diabetes Neg Hx   . Prostate cancer Neg Hx   . Colon cancer Neg Hx     Social History   Social History  . Marital status: Divorced    Spouse name: N/A  . Number of children: 1  . Years of education: N/A   Occupational History  . Retired     as Office manager   Social History Main Topics  . Smoking status: Never Smoker  . Smokeless tobacco: Never Used  . Alcohol use No  . Drug use: No  . Sexual activity: Not on file   Other Topics Concern  . Not on file   Social History Narrative   Has living will   [facility] friend, [name], is health care POA   Would accept resuscitation attempts    No feeding tube if cognitively unaware  Review of Systems  No dysuria Slow urine stream--not changed Mild constipation--also not changed     Objective:   Physical Exam  Abdominal: Soft. He exhibits no distension. There is no tenderness. There is no rebound and no guarding.  Genitourinary:  Genitourinary Comments: Scrotum quiet. Normal testes Small reducible hernia on right--mildly tender          Assessment & Plan:

## 2017-04-04 NOTE — Patient Instructions (Signed)

## 2017-05-20 ENCOUNTER — Encounter: Payer: Self-pay | Admitting: *Deleted

## 2017-05-20 ENCOUNTER — Encounter: Payer: Self-pay | Admitting: Internal Medicine

## 2017-05-20 ENCOUNTER — Ambulatory Visit (INDEPENDENT_AMBULATORY_CARE_PROVIDER_SITE_OTHER): Payer: Medicare HMO | Admitting: Internal Medicine

## 2017-05-20 VITALS — BP 142/82 | HR 50 | Temp 97.7°F | Wt 185.8 lb

## 2017-05-20 DIAGNOSIS — K4090 Unilateral inguinal hernia, without obstruction or gangrene, not specified as recurrent: Secondary | ICD-10-CM

## 2017-05-20 NOTE — Progress Notes (Signed)
Subjective:    Patient ID: Todd Hodges, male    DOB: 1941/02/23, 76 y.o.   MRN: [id number]  HPI Here due to bulge on right side Lots of gas Not hurting like it was before  He notices more bulging after eating It gurgles at times  Current Outpatient Medications on File Prior to Visit  Medication Sig Dispense Refill  . aspirin 81 MG tablet Take 81 mg by mouth every other day.     . Calcium Polycarbophil 625 MG CHEW Chew by mouth daily.     Marland Kitchen FIBER COMPLETE TABS Take by mouth.    . fluorouracil (EFUDEX) 5 % cream Apply 1 Container topically as needed.    Marland Kitchen GLUCOSAMINE CHONDROITIN COMPLX PO Take 2 tablets by mouth daily.    . hydrocortisone 2.5 % cream Apply topically 2 (two) times daily as needed. For itching 60 g 1  . Investigational - Study Medication Additional Study Details: [facility] CLINICAL TRIAL    . vitamin B-12 (CYANOCOBALAMIN) 1000 MCG tablet Take 1,000 mcg by mouth daily.       No current facility-administered medications on file prior to visit.     No Known Allergies  Past Medical History:  Diagnosis Date  . Anemia    b12 deficiency  . Arthritis   . Cataract    had surgery on both eyes  . Hx of adenomatous colonic polyps   . Hypertension   . IBS (irritable bowel syndrome)   . Osteoporosis    knees  . Vitamin B12 deficiency    has responded to oral therapy    Past Surgical History:  Procedure Laterality Date  . 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 CATARACT EXTRACTION  2010   Bilateral  . COLONOSCOPY    . EYE SURGERY  07/08/2008   cataracts bilaterally  . POLYPECTOMY      Family History  Problem Relation Age of Onset  . Hypertension Father   . Cancer Sister        neck  . Heart disease Neg Hx   . Diabetes Neg Hx   . Prostate cancer Neg Hx   . Colon cancer Neg Hx     Social History   Socioeconomic History  . Marital status: Divorced    Spouse name: Not on file  . Number of children: 1  . Years of education:  Not on file  . Highest education level: Not on file  Social Needs  . Financial resource strain: Not on file  . Food insecurity - worry: Not on file  . Food insecurity - inability: Not on file  . Transportation needs - medical: Not on file  . Transportation needs - non-medical: Not on file  Occupational History  . Occupation: Retired    Comment: as Office manager  Tobacco Use  . Smoking status: Never Smoker  . Smokeless tobacco: Never Used  Substance and Sexual Activity  . Alcohol use: No  . Drug use: No  . Sexual activity: Not on file  Other Topics Concern  . Not on file  Social History Narrative   Has living will   [facility] friend, [name], is health care POA   Would accept resuscitation attempts    No feeding tube if cognitively unaware   Review of Systems Bowels are okay--occasionally strains. Has tried some prunes No fever No N/V Appetite is fine    Objective:   Physical Exam  Constitutional: No distress.  Genitourinary:  Genitourinary Comments: Larger right inguinal hernia Now into scrotum slightly Mild tenderness Still easily reducible          Assessment & Plan:

## 2017-05-20 NOTE — Assessment & Plan Note (Signed)
More prominent I think he will need surgical repair --so will get him in with surgeon

## 2017-06-02 ENCOUNTER — Ambulatory Visit: Payer: Medicare HMO | Admitting: General Surgery

## 2017-06-02 ENCOUNTER — Encounter: Payer: Self-pay | Admitting: General Surgery

## 2017-06-02 VITALS — BP 130/80 | HR 70 | Resp 14 | Ht 66.0 in | Wt 185.0 lb

## 2017-06-02 DIAGNOSIS — K4090 Unilateral inguinal hernia, without obstruction or gangrene, not specified as recurrent: Secondary | ICD-10-CM

## 2017-06-02 NOTE — Progress Notes (Signed)
Patient ID: Todd Hodges, male   DOB: 01-04-1941, 76 y.o.   MRN: [id number]  Chief Complaint  Patient presents with  . Other    HPI Todd Hodges is a 76 y.o. male here today for a evaluation of a right inguinal hernia. Patient noticed this area about two months ago. The area pops in and out. No pain but the area goes in at night and comes out in the day time with activity. Moves his bowels daily. Geni Bers, friend is present at visit.  HPI  Past Medical History:  Diagnosis Date  . Anemia    b12 deficiency  . Arthritis   . Cataract    had surgery on both eyes  . Hx of adenomatous colonic polyps   . Hypertension   . IBS (irritable bowel syndrome)   . Osteoporosis    knees  . Vitamin B12 deficiency    has responded to oral therapy    Past Surgical History:  Procedure Laterality Date  . 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 CATARACT EXTRACTION  2010   Bilateral  . COLONOSCOPY  06/2016  . EYE SURGERY  07/08/2008   cataracts bilaterally  . POLYPECTOMY      Family History  Problem Relation Age of Onset  . Hypertension Father   . Cancer Sister        neck  . Heart disease Neg Hx   . Diabetes Neg Hx   . Prostate cancer Neg Hx   . Colon cancer Neg Hx     Social History Social History   Tobacco Use  . Smoking status: Never Smoker  . Smokeless tobacco: Never Used  Substance Use Topics  . Alcohol use: No  . Drug use: No    No Known Allergies  Current Outpatient Medications  Medication Sig Dispense Refill  . aspirin 81 MG tablet Take 81 mg by mouth every other day.     . fluorouracil (EFUDEX) 5 % cream Apply 1 application topically daily as needed (itching / rash).     . hydrocortisone 2.5 % cream Apply topically 2 (two) times daily as needed. For itching (Patient taking differently: Apply 1 application topically 2 (two) times daily as needed. For itching) 60 g 1  . Investigational - Study Medication Take 3 tablets by  mouth daily. Additional Study Details: [facility] CLINICAL TRIAL  4 tear trial , 2 years left  2 cocoa tablets and 1 Multivitamin /or placebo  Cocoa Extract, flavonoids and multivitamin    . vitamin B-12 (CYANOCOBALAMIN) 1000 MCG tablet Take 1,000 mcg by mouth daily.      . Glucosamine-Chondroit-Vit C-Mn (GLUCOSAMINE CHONDR 1500 COMPLX PO) Take 2 capsules by mouth daily.    . polycarbophil (FIBERCON) 625 MG tablet Take 1,250 mg by mouth daily.     No current facility-administered medications for this visit.     Review of Systems Review of Systems  Blood pressure 130/80, pulse 70, resp. rate 14, height 5\' 6"  (1.676 m), weight 185 lb (83.9 kg).  Physical Exam Physical Exam  Constitutional: He is oriented to person, place, and time. He appears well-developed and well-nourished.  Eyes: Conjunctivae are normal. No scleral icterus.  Neck: Neck supple.  Cardiovascular: Normal rate, regular rhythm and normal heart sounds.  Occasional premature beats noted.  Pulmonary/Chest: Effort normal and breath sounds normal.  Lymphadenopathy:    He has no cervical adenopathy.  Neurological: He is alert and oriented to person, place, and time.  Skin: Skin is warm and dry.    Data Reviewed December 18,, 2017 colonoscopy results: Diagnosis 1. Surgical [P], transverse, polyp -BENIGN POLYPOID COLONIC MUCOSA. -NO ADENOMATOUS CHANGE OR MALIGNANCY IDENTIFIED. 2. Surgical [P], descending, polyp -BENIGN POLYPOID COLONIC MUCOSA. -NO ADENOMATOUS CHANGE OR MALIGNANCY IDENTIFIED.  CBC of 02/15/2016 showed a hemoglobin of 13.3 with an MCV of 98, white blood cell count 6900.  PCP note of 05/20/2017 reviewed.  Assessment    Right inguinal hernia,modest increase in size since initial discovery.    Plan    The patient remains very active and elective repair is appropriate.    Hernia precautions and incarceration were discussed with the patient. If they develop symptoms of an incarcerated hernia, they were  encouraged to seek prompt medical attention.  I have recommended repair of the hernia using mesh on an outpatient basis in the near future. The risk of infection was reviewed. The role of prosthetic mesh to minimize the risk of recurrence was reviewed.    HPI, Physical Exam, Assessment and Plan have been scribed under the direction and in the presence of Hervey Ard, MD.  Gaspar Cola, CMA  I have completed the exam and reviewed the above documentation for accuracy and completeness.  I agree with the above.  Haematologist has been used and any errors in dictation or transcription are unintentional.  Hervey Ard, M.D., F.A.C.S.  Robert Bellow 06/02/2017, 8:03 PM  Patient's surgery has been scheduled for 06-09-17 at [facility]. It is okay for patient to continue an 81 mg aspirin once daily.   Dominga Ferry, CMA

## 2017-06-02 NOTE — Patient Instructions (Signed)

## 2017-06-03 ENCOUNTER — Encounter
Admission: RE | Admit: 2017-06-03 | Discharge: 2017-06-03 | Disposition: A | Discharge status: Home / Self Care | Payer: Medicare HMO | Source: Ambulatory Visit | Attending: General Surgery | Admitting: General Surgery

## 2017-06-03 ENCOUNTER — Other Ambulatory Visit: Payer: Self-pay

## 2017-06-03 HISTORY — DX: Malignant (primary) neoplasm, unspecified: C80.1

## 2017-06-03 HISTORY — DX: Gastro-esophageal reflux disease without esophagitis: K21.9

## 2017-06-03 HISTORY — DX: Cardiac arrhythmia, unspecified: I49.9

## 2017-06-03 NOTE — Patient Instructions (Signed)
Your procedure is scheduled on: 06-09-17 MONDAY Report to Same Day Surgery 2nd floor medical mall [facility] Entrance-take elevator on left to 2nd floor.  Check in with surgery information desk.) To find out your arrival time please call [phone] between 1PM - 3PM on 06-06-17 FRIDAY  Remember: Instructions that are not followed completely may result in serious medical risk, up to and including death, or upon the discretion of your surgeon and anesthesiologist your surgery may need to be rescheduled.    _x___ 1. Do not eat food after midnight the night before your procedure. NO GUM CHEWING OR CANDY AFTER MIDNIGHT.  You may drink clear liquids up to 2 hours before you are scheduled to arrive at the hospital for your procedure.  Do not drink clear liquids within 2 hours of your scheduled arrival to the hospital.  Clear liquids include  --Water or Apple juice without pulp  --Clear carbohydrate beverage such as ClearFast or Gatorade  --Black Coffee or Clear Tea (No milk, no creamers, do not add anything to the coffee or Tea     __x__ 2. No Alcohol for 24 hours before or after surgery.   __x__3. No Smoking for 24 prior to surgery.   ____  4. Bring all medications with you on the day of surgery if instructed.    __x__ 5. Notify your doctor if there is any change in your medical condition     (cold, fever, infections).     Do not wear jewelry, make-up, hairpins, clips or nail polish.  Do not wear lotions, powders, or perfumes. You may wear deodorant.  Do not shave 48 hours prior to surgery. Men may shave face and neck.  Do not bring valuables to the hospital.    [facility] is not responsible for any belongings or valuables.               Contacts, dentures or bridgework may not be worn into surgery.  Leave your suitcase in the car. After surgery it may be brought to your room.  For patients admitted to the hospital, discharge time is determined by your treatment team.   Patients  discharged the day of surgery will not be allowed to drive home.  You will need someone to drive you home and stay with you the night of your procedure.    Please read over the following fact sheets that you were given:   [facility] Preparing for Surgery and or MRSA Information   ____ Take anti-hypertensive listed below, cardiac, seizure, asthma, anti-reflux and psychiatric medicines. These include:  1. NONE  2.  3.  4.  5.  6.  ____Fleets enema or Magnesium Citrate as directed.   _x___ Use CHG Soap or sage wipes as directed on instruction sheet   ____ Use inhalers on the day of surgery and bring to hospital day of surgery  ____ Stop Metformin and Janumet 2 days prior to surgery.    ____ Take 1/2 of usual insulin dose the night before surgery and none on the morning surgery.   _x___ Follow recommendations from Cardiologist, Pulmonologist or PCP regarding stopping Aspirin, Coumadin, Plavix ,Eliquis, Effient, or Pradaxa, and Pletal-OK TO CONTINUE 81 MG ASPIRIN-DO NOT TAKE AM OF SURGERY  ____Stop Anti-inflammatories such as Advil, Aleve, Ibuprofen, Motrin, Naproxen, Naprosyn, Goodies powders or aspirin products. OK to take Tylenol    _x___ Stop supplements until after surgery-STOP GLUCOSAMINE-CHONDROITIN NOW-MAY RESUME AFTER SURGERY   ____ Bring C-Pap to the hospital.

## 2017-06-04 ENCOUNTER — Encounter
Admission: RE | Admit: 2017-06-04 | Discharge: 2017-06-04 | Disposition: A | Discharge status: Home / Self Care | Payer: Medicare HMO | Source: Ambulatory Visit | Attending: General Surgery | Admitting: General Surgery

## 2017-06-04 DIAGNOSIS — Z01810 Encounter for preprocedural cardiovascular examination: Secondary | ICD-10-CM | POA: Diagnosis present

## 2017-06-04 DIAGNOSIS — K4090 Unilateral inguinal hernia, without obstruction or gangrene, not specified as recurrent: Secondary | ICD-10-CM | POA: Insufficient documentation

## 2017-06-04 DIAGNOSIS — R001 Bradycardia, unspecified: Secondary | ICD-10-CM | POA: Insufficient documentation

## 2017-06-04 DIAGNOSIS — Z01818 Encounter for other preprocedural examination: Secondary | ICD-10-CM | POA: Diagnosis not present

## 2017-06-08 MED ORDER — CEFAZOLIN SODIUM-DEXTROSE 2-4 GM/100ML-% IV SOLN
2.0000 g | INTRAVENOUS | Status: AC
  Administered 2017-06-09: 2 g via INTRAVENOUS

## 2017-06-09 ENCOUNTER — Ambulatory Visit: Payer: Medicare HMO | Admitting: Anesthesiology

## 2017-06-09 ENCOUNTER — Ambulatory Visit
Admission: RE | Admit: 2017-06-09 | Discharge: 2017-06-09 | Disposition: A | Discharge status: Home / Self Care | Payer: Medicare HMO | Source: Ambulatory Visit | Attending: General Surgery | Admitting: General Surgery

## 2017-06-09 ENCOUNTER — Encounter
Admission: RE | Disposition: A | Discharge status: Home / Self Care | Payer: Self-pay | Source: Ambulatory Visit | Attending: General Surgery

## 2017-06-09 ENCOUNTER — Other Ambulatory Visit: Payer: Self-pay

## 2017-06-09 DIAGNOSIS — Z86010 Personal history of colonic polyps: Secondary | ICD-10-CM | POA: Insufficient documentation

## 2017-06-09 DIAGNOSIS — Z79899 Other long term (current) drug therapy: Secondary | ICD-10-CM | POA: Diagnosis not present

## 2017-06-09 DIAGNOSIS — M810 Age-related osteoporosis without current pathological fracture: Secondary | ICD-10-CM | POA: Insufficient documentation

## 2017-06-09 DIAGNOSIS — I499 Cardiac arrhythmia, unspecified: Secondary | ICD-10-CM | POA: Insufficient documentation

## 2017-06-09 DIAGNOSIS — K589 Irritable bowel syndrome without diarrhea: Secondary | ICD-10-CM | POA: Diagnosis not present

## 2017-06-09 DIAGNOSIS — Z85828 Personal history of other malignant neoplasm of skin: Secondary | ICD-10-CM | POA: Insufficient documentation

## 2017-06-09 DIAGNOSIS — K4090 Unilateral inguinal hernia, without obstruction or gangrene, not specified as recurrent: Secondary | ICD-10-CM | POA: Diagnosis not present

## 2017-06-09 DIAGNOSIS — E538 Deficiency of other specified B group vitamins: Secondary | ICD-10-CM | POA: Diagnosis not present

## 2017-06-09 DIAGNOSIS — D176 Benign lipomatous neoplasm of spermatic cord: Secondary | ICD-10-CM | POA: Insufficient documentation

## 2017-06-09 DIAGNOSIS — M1990 Unspecified osteoarthritis, unspecified site: Secondary | ICD-10-CM | POA: Insufficient documentation

## 2017-06-09 DIAGNOSIS — I10 Essential (primary) hypertension: Secondary | ICD-10-CM | POA: Diagnosis not present

## 2017-06-09 DIAGNOSIS — Z7982 Long term (current) use of aspirin: Secondary | ICD-10-CM | POA: Diagnosis not present

## 2017-06-09 HISTORY — PX: INGUINAL HERNIA REPAIR: SHX194

## 2017-06-09 SURGERY — REPAIR, HERNIA, INGUINAL, ADULT
Anesthesia: General | Laterality: Right

## 2017-06-09 MED ORDER — MIDAZOLAM HCL 2 MG/2ML IJ SOLN
INTRAMUSCULAR | Status: AC
  Filled 2017-06-09: qty 2

## 2017-06-09 MED ORDER — SEVOFLURANE IN SOLN
RESPIRATORY_TRACT | Status: AC
  Filled 2017-06-09: qty 250

## 2017-06-09 MED ORDER — DEXAMETHASONE SODIUM PHOSPHATE 10 MG/ML IJ SOLN
INTRAMUSCULAR | Status: DC | PRN
  Administered 2017-06-09: 10 mg via INTRAVENOUS

## 2017-06-09 MED ORDER — ACETAMINOPHEN 10 MG/ML IV SOLN
INTRAVENOUS | Status: AC
  Filled 2017-06-09: qty 100

## 2017-06-09 MED ORDER — BUPIVACAINE-EPINEPHRINE (PF) 0.5% -1:200000 IJ SOLN
INTRAMUSCULAR | Status: AC
  Filled 2017-06-09: qty 30

## 2017-06-09 MED ORDER — FAMOTIDINE 20 MG PO TABS
ORAL_TABLET | ORAL | Status: AC
  Administered 2017-06-09: 20 mg via ORAL
  Filled 2017-06-09: qty 1

## 2017-06-09 MED ORDER — ACETAMINOPHEN 10 MG/ML IV SOLN
INTRAVENOUS | Status: DC | PRN
  Administered 2017-06-09: 1000 mg via INTRAVENOUS

## 2017-06-09 MED ORDER — EPHEDRINE SULFATE 50 MG/ML IJ SOLN
INTRAMUSCULAR | Status: DC | PRN
  Administered 2017-06-09: 15 mg via INTRAVENOUS
  Administered 2017-06-09: 10 mg via INTRAVENOUS

## 2017-06-09 MED ORDER — FENTANYL CITRATE (PF) 100 MCG/2ML IJ SOLN
INTRAMUSCULAR | Status: DC | PRN
  Administered 2017-06-09 (×2): 50 ug via INTRAVENOUS

## 2017-06-09 MED ORDER — FENTANYL CITRATE (PF) 100 MCG/2ML IJ SOLN: 25.0000 ug | INTRAMUSCULAR | Status: DC | PRN

## 2017-06-09 MED ORDER — LACTATED RINGERS IV SOLN
INTRAVENOUS | Status: DC
  Administered 2017-06-09 (×2): via INTRAVENOUS

## 2017-06-09 MED ORDER — MIDAZOLAM HCL 2 MG/2ML IJ SOLN
INTRAMUSCULAR | Status: DC | PRN
  Administered 2017-06-09 (×2): 1 mg via INTRAVENOUS

## 2017-06-09 MED ORDER — ONDANSETRON HCL 4 MG/2ML IJ SOLN: 4.0000 mg | Freq: Once | INTRAMUSCULAR | Status: DC | PRN

## 2017-06-09 MED ORDER — ONDANSETRON HCL 4 MG/2ML IJ SOLN
INTRAMUSCULAR | Status: DC | PRN
  Administered 2017-06-09: 4 mg via INTRAVENOUS

## 2017-06-09 MED ORDER — FAMOTIDINE 20 MG PO TABS
20.0000 mg | ORAL_TABLET | Freq: Once | ORAL | Status: AC
  Administered 2017-06-09: 20 mg via ORAL

## 2017-06-09 MED ORDER — PROPOFOL 10 MG/ML IV BOLUS
INTRAVENOUS | Status: DC | PRN
  Administered 2017-06-09: 120 mg via INTRAVENOUS

## 2017-06-09 MED ORDER — LIDOCAINE HCL (CARDIAC) 20 MG/ML IV SOLN
INTRAVENOUS | Status: DC | PRN
  Administered 2017-06-09: 100 mg via INTRAVENOUS

## 2017-06-09 MED ORDER — BUPIVACAINE-EPINEPHRINE (PF) 0.5% -1:200000 IJ SOLN
INTRAMUSCULAR | Status: DC | PRN
  Administered 2017-06-09: 30 mL via PERINEURAL

## 2017-06-09 MED ORDER — KETOROLAC TROMETHAMINE 30 MG/ML IJ SOLN
INTRAMUSCULAR | Status: DC | PRN
  Administered 2017-06-09: 30 mg

## 2017-06-09 MED ORDER — FENTANYL CITRATE (PF) 100 MCG/2ML IJ SOLN
INTRAMUSCULAR | Status: AC
  Filled 2017-06-09: qty 2

## 2017-06-09 MED ORDER — HYDROCODONE-ACETAMINOPHEN 5-325 MG PO TABS: 1.0000 | ORAL_TABLET | ORAL | 0 refills | Status: DC | PRN

## 2017-06-09 MED ORDER — KETOROLAC TROMETHAMINE 30 MG/ML IJ SOLN
INTRAMUSCULAR | Status: DC | PRN
Start: 2017-06-09 — End: 2017-06-09
  Administered 2017-06-09: 30 mg via INTRAVENOUS

## 2017-06-09 MED ORDER — DEXAMETHASONE SODIUM PHOSPHATE 10 MG/ML IJ SOLN
INTRAMUSCULAR | Status: AC
  Filled 2017-06-09: qty 1

## 2017-06-09 MED ORDER — CEFAZOLIN SODIUM-DEXTROSE 2-4 GM/100ML-% IV SOLN
INTRAVENOUS | Status: AC
  Filled 2017-06-09: qty 100

## 2017-06-09 MED ORDER — GLYCOPYRROLATE 0.2 MG/ML IJ SOLN
INTRAMUSCULAR | Status: DC | PRN
  Administered 2017-06-09: 0.2 mg via INTRAVENOUS

## 2017-06-09 SURGICAL SUPPLY — 38 items
BLADE SURG 15 STRL SS SAFETY (BLADE) ×4 IMPLANT
CANISTER SUCT 1200ML W/VALVE (MISCELLANEOUS) ×2 IMPLANT
CHLORAPREP W/TINT 26ML (MISCELLANEOUS) ×2 IMPLANT
DECANTER SPIKE VIAL GLASS SM (MISCELLANEOUS) ×2 IMPLANT
DRAIN PENROSE 1/4X12 LTX (DRAIN) ×2 IMPLANT
DRAPE LAPAROTOMY 100X77 ABD (DRAPES) ×2 IMPLANT
DRSG TEGADERM 4X4.75 (GAUZE/BANDAGES/DRESSINGS) ×2 IMPLANT
DRSG TELFA 4X3 1S NADH ST (GAUZE/BANDAGES/DRESSINGS) ×2 IMPLANT
ELECT REM PT RETURN 9FT ADLT (ELECTROSURGICAL) ×2
ELECTRODE REM PT RTRN 9FT ADLT (ELECTROSURGICAL) ×1 IMPLANT
GLOVE BIO SURGEON STRL SZ 6.5 (GLOVE) ×1 IMPLANT
GLOVE BIO SURGEON STRL SZ7 (GLOVE) ×2 IMPLANT
GLOVE BIO SURGEON STRL SZ7.5 (GLOVE) ×2 IMPLANT
GLOVE INDICATOR 7.5 STRL GRN (GLOVE) ×2 IMPLANT
GLOVE INDICATOR 8.0 STRL GRN (GLOVE) ×2 IMPLANT
GOWN STRL REUS W/ TWL LRG LVL3 (GOWN DISPOSABLE) ×2 IMPLANT
GOWN STRL REUS W/TWL LRG LVL3 (GOWN DISPOSABLE) ×8
KIT RM TURNOVER STRD PROC AR (KITS) ×2 IMPLANT
LABEL OR SOLS (LABEL) ×2 IMPLANT
MESH HERNIA 6X12 ULTRAPRO MED (Mesh General) ×1 IMPLANT
MESH HERNIA ULTRAPRO MED (Mesh General) ×1 IMPLANT
NDL HYPO 25X1 1.5 SAFETY (NEEDLE) ×1 IMPLANT
NEEDLE HYPO 22GX1.5 SAFETY (NEEDLE) ×4 IMPLANT
NEEDLE HYPO 25X1 1.5 SAFETY (NEEDLE) ×2 IMPLANT
NS IRRIG 500ML POUR BTL (IV SOLUTION) ×1 IMPLANT
PACK BASIN MINOR ARMC (MISCELLANEOUS) ×2 IMPLANT
STRIP CLOSURE SKIN 1/2X4 (GAUZE/BANDAGES/DRESSINGS) ×2 IMPLANT
SUT SURGILON 0 BLK (SUTURE) ×4 IMPLANT
SUT VIC AB 2-0 SH 27 (SUTURE) ×2
SUT VIC AB 2-0 SH 27XBRD (SUTURE) ×1 IMPLANT
SUT VIC AB 3-0 54X BRD REEL (SUTURE) ×1 IMPLANT
SUT VIC AB 3-0 BRD 54 (SUTURE) ×2
SUT VIC AB 3-0 SH 27 (SUTURE) ×2
SUT VIC AB 3-0 SH 27X BRD (SUTURE) ×1 IMPLANT
SUT VIC AB 4-0 FS2 27 (SUTURE) ×2 IMPLANT
SWABSTK COMLB BENZOIN TINCTURE (MISCELLANEOUS) ×2 IMPLANT
SYR 3ML LL SCALE MARK (SYRINGE) ×2 IMPLANT
SYR CONTROL 10ML (SYRINGE) ×4 IMPLANT

## 2017-06-09 NOTE — Anesthesia Preprocedure Evaluation (Signed)
Anesthesia Evaluation  Patient identified by MRN, date of birth, ID band Patient awake    Reviewed: Allergy & Precautions, H&P , NPO status , Patient's Chart, lab work & pertinent test results, reviewed documented beta blocker date and time   History of Anesthesia Complications Negative for: history of anesthetic complications  Airway Mallampati: I  TM Distance: >3 FB Neck ROM: full    Dental  (+) Caps, Dental Advidsory Given   Pulmonary neg pulmonary ROS,           Cardiovascular Exercise Tolerance: Good hypertension, (-) angina(-) CAD, (-) Past MI, (-) Cardiac Stents and (-) CABG (-) dysrhythmias (-) Valvular Problems/Murmurs     Neuro/Psych negative neurological ROS  negative psych ROS   GI/Hepatic Neg liver ROS, GERD  ,  Endo/Other  negative endocrine ROS  Renal/GU negative Renal ROS  negative genitourinary   Musculoskeletal   Abdominal   Peds  Hematology negative hematology ROS (+)   Anesthesia Other Findings Past Medical History: No date: Anemia     Comment:  b12 deficiency No date: Arthritis No date: Cancer ([address])     Comment:  SKIN CANCER No date: Cataract     Comment:  had surgery on both eyes No date: GERD (gastroesophageal reflux disease)     Comment:  NO MEDS No date: Hx of adenomatous colonic polyps No date: Hypertension     Comment:  NO MEDS No date: IBS (irritable bowel syndrome) No date: Irregular heart beats No date: Osteoporosis     Comment:  knees No date: Vitamin B12 deficiency     Comment:  has responded to oral therapy   Reproductive/Obstetrics negative OB ROS                             Anesthesia Physical Anesthesia Plan  ASA: II  Anesthesia Plan: General   Post-op Pain Management:    Induction: Intravenous  PONV Risk Score and Plan: 2 and Ondansetron and Dexamethasone  Airway Management Planned: LMA  Additional Equipment:   Intra-op Plan:    Post-operative Plan: Extubation in OR  Informed Consent: I have reviewed the patients History and Physical, chart, labs and discussed the procedure including the risks, benefits and alternatives for the proposed anesthesia with the patient or authorized representative who has indicated his/her understanding and acceptance.   Dental Advisory Given  Plan Discussed with: Anesthesiologist, CRNA and Surgeon  Anesthesia Plan Comments:         Anesthesia Quick Evaluation

## 2017-06-09 NOTE — Op Note (Signed)
Preoperative diagnosis: Right inguinal hernia.  Postoperative diagnosis: Same, indirect.  Operative procedure: Repair of right indirect inguinal hernia with medium Ultra Pro mesh.  Operating Surgeon: Hervey Ard, MD.  Anesthesia: General by LMA, Marcaine 0.5% with 1-200,000 units of epinephrine, 30 cc; Toradol 30 mg.  Estimated blood loss less than 5 cc  Clinical note: This 76 year old active male has developed a symptomatic right inguinal hernia that has increased in size since initial presentation several months ago.  He is admitted for elective repair.  Air was removed from the surgical site prior to presentation to the operating theater.  He received Kefzol prior to the procedure.  Operative note: With the patient under adequate general anesthesia the area was prepped with ChloraPrep and draped.  Field block anesthesia was established and a 5 cm skin line incision along the anticipated course of the inguinal canal was carried down to the skin subtendinous tissue with hemostasis achieved by electrocautery and 3-0 Vicryl ties.  The external oblique was opened in the direction of its fibers.  The ilioinguinal and iliohypogastric nerves were identified and protected.  The cord was dissected and a lipoma the cord resected and discarded.  Hemostasis with a 3-0 Vicryl tie.  A moderate size indirect sac with some inflammation was dissected free from the cord into the preperitoneal space.  The preperitoneal space was cleared and a medium Ultra Pro mesh smooth into position.  The external component was laid along the floor of the canal with a lateral slit for cord passage.  This was closed with interrupted 0 Surgilon sutures.  The mesh was anchored to the pubic tubercle and then along the inguinal ligament with interrupted 0 Surgilon sutures.  The medial and superior borders of the mesh were anchored to the transverse abdominis aponeurosis with interrupted 0 Surgilon sutures.  Toradol was placed into  the wound.  The nerves were returned to their normal location and the external oblique closed with a running 2-0 Vicryl suture.  Scarpa's fascia was closed with a running 3-0 Vicryl suture and the skin closed with a running 4-0 Vicryl subarticular suture.  Benzoin, Steri-Strips, Telfa and Tegaderm dressing applied.  The patient tolerated the procedure well and was taken to the recovery room in stable condition.

## 2017-06-09 NOTE — Transfer of Care (Signed)
Immediate Anesthesia Transfer of Care Note  Patient: Todd Hodges  Procedure(s) Performed: HERNIA REPAIR INGUINAL ADULT (Right )  Patient Location: PACU  Anesthesia Type:General  Level of Consciousness: sedated  Airway & Oxygen Therapy: Patient Spontanous Breathing and Patient connected to face mask oxygen  Post-op Assessment: Report given to RN and Post -op Vital signs reviewed and stable  Post vital signs: Reviewed and stable  Last Vitals:  Vitals:   06/09/17 0831 06/09/17 1100  BP: (!) 179/87 132/76  Pulse: (!) 48 76  Resp: 16 17  Temp: (!) 35.7 C 36.7 C  SpO2: 98% 97%    Last Pain:  Vitals:   06/09/17 0831  TempSrc: Tympanic         Complications: No apparent anesthesia complications

## 2017-06-09 NOTE — Anesthesia Procedure Notes (Addendum)
Procedure Name: LMA Insertion Date/Time: 06/09/2017 10:00 AM Performed by: Justus Memory, CRNA Pre-anesthesia Checklist: Patient identified, Patient being monitored, Timeout performed, Emergency Drugs available and Suction available Patient Re-evaluated:Patient Re-evaluated prior to induction Oxygen Delivery Method: Circle system utilized Preoxygenation: Pre-oxygenation with 100% oxygen Induction Type: IV induction Ventilation: Mask ventilation without difficulty LMA: LMA inserted LMA Size: 4.5 Tube type: Oral Number of attempts: 1 Placement Confirmation: positive ETCO2 and breath sounds checked- equal and bilateral Tube secured with: Tape Dental Injury: Teeth and Oropharynx as per pre-operative assessment

## 2017-06-09 NOTE — Anesthesia Post-op Follow-up Note (Signed)
Anesthesia QCDR form completed.        

## 2017-06-09 NOTE — Discharge Instructions (Signed)

## 2017-06-09 NOTE — H&P (Signed)
No change in clinical exam or history. For right inguinal hernia repair.

## 2017-06-10 ENCOUNTER — Encounter: Payer: Self-pay | Admitting: General Surgery

## 2017-06-10 NOTE — Anesthesia Postprocedure Evaluation (Signed)
Anesthesia Post Note  Patient: Hendrix Yurkovich  Procedure(s) Performed: HERNIA REPAIR INGUINAL ADULT (Right )  Patient location during evaluation: PACU Anesthesia Type: General Level of consciousness: awake and alert Pain management: pain level controlled Vital Signs Assessment: post-procedure vital signs reviewed and stable Respiratory status: spontaneous breathing, nonlabored ventilation, respiratory function stable and patient connected to nasal cannula oxygen Cardiovascular status: blood pressure returned to baseline and stable Postop Assessment: no apparent nausea or vomiting Anesthetic complications: no     Last Vitals:  Vitals:   06/09/17 1144 06/09/17 1153  BP: 136/78 (!) 141/73  Pulse: 60 (!) 51  Resp: 17 14  Temp: 36.6 C (!) 36.3 C  SpO2: 94% 97%    Last Pain:  Vitals:   06/10/17 0903  TempSrc:   PainSc: 1                  Martha Clan

## 2017-06-13 ENCOUNTER — Telehealth: Payer: Self-pay

## 2017-06-13 NOTE — Telephone Encounter (Signed)
Patient called back and tried using Fleets enema about 2 hours ago. He states he got all the fluid in and held it for about 10 minutes. He reports the only thing that came out was what he put in, no bowel movement. He denies any watery diarrhea, only some gas.  Instructed to make use of Magnesium Citrate liquid over the counter. To take 1/2 to 1 bottle by mouth. Patient instructed that if this does not work he may need to be seen for possible impaction. He is aware of this and will keep [place] notified.

## 2017-06-13 NOTE — Telephone Encounter (Signed)
Patient has been having constipation since his surgery on 06/09/17. He uses Miralax 1 cap full daily. He has tried taking Dulcolax two tablets twice since yesterday and has only had some gas, but no bowel movement.  Patient advised to use Fleets enema x 1 as directed for relief of constipation. He has been instructed to call back if he has no results by this evening.

## 2017-06-16 ENCOUNTER — Ambulatory Visit: Payer: Medicare HMO | Admitting: General Surgery

## 2017-06-18 ENCOUNTER — Ambulatory Visit (INDEPENDENT_AMBULATORY_CARE_PROVIDER_SITE_OTHER): Payer: Medicare HMO | Admitting: General Surgery

## 2017-06-18 ENCOUNTER — Encounter: Payer: Self-pay | Admitting: General Surgery

## 2017-06-18 VITALS — BP 148/86 | HR 58 | Resp 11 | Ht 66.0 in | Wt 188.0 lb

## 2017-06-18 DIAGNOSIS — K4090 Unilateral inguinal hernia, without obstruction or gangrene, not specified as recurrent: Secondary | ICD-10-CM

## 2017-06-18 NOTE — Progress Notes (Signed)
Patient ID: Todd Hodges, male   DOB: 06/16/1941, 76 y.o.   MRN: [id number]  Chief Complaint  Patient presents with  . Routine Post Op    HPI Todd Hodges is a 76 y.o. male.  Here today for postoperative visit, hernia repair 06-09-17, he states he is doing well. Denies any gastrointestinal issues. Bowels are moving regular after using miralax, dulcolax and enemas.After his second enema, poorly tolerated, he did have a large bowel movement and since that time he is had no further problems. The patient reports using no narcotics after surgery.  HPI  Past Medical History:  Diagnosis Date  . Anemia    b12 deficiency  . Arthritis   . Cancer [facility])    SKIN CANCER  . Cataract    had surgery on both eyes  . GERD (gastroesophageal reflux disease)    NO MEDS  . Hx of adenomatous colonic polyps   . Hypertension    NO MEDS  . IBS (irritable bowel syndrome)   . Irregular heart beats   . Osteoporosis    knees  . Vitamin B12 deficiency    has responded to oral therapy    Past Surgical History:  Procedure Laterality Date  . 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 CATARACT EXTRACTION  2010   Bilateral  . COLONOSCOPY  06/2016  . EYE SURGERY  07/08/2008   cataracts bilaterally  . INGUINAL HERNIA REPAIR Right 06/09/2017   Medium Ultra Pro mesh.;  Surgeon: Robert Bellow, MD;  Location: [facility] ORS;  Service: General;  Laterality: Right;  . POLYPECTOMY      Family History  Problem Relation Age of Onset  . Hypertension Father   . Cancer Sister        neck  . Heart disease Neg Hx   . Diabetes Neg Hx   . Prostate cancer Neg Hx   . Colon cancer Neg Hx     Social History Social History   Tobacco Use  . Smoking status: Never Smoker  . Smokeless tobacco: Never Used  Substance Use Topics  . Alcohol use: No  . Drug use: No    No Known Allergies  Current Outpatient Medications  Medication Sig Dispense Refill  . aspirin 81 MG tablet Take  81 mg by mouth every other day.     . fluorouracil (EFUDEX) 5 % cream Apply 1 application topically daily as needed (itching / rash).     . Glucosamine-Chondroit-Vit C-Mn (GLUCOSAMINE CHONDR 1500 COMPLX PO) Take 2 capsules by mouth daily.    . hydrocortisone 2.5 % cream Apply topically 2 (two) times daily as needed. For itching (Patient taking differently: Apply 1 application topically 2 (two) times daily as needed. For itching) 60 g 1  . Investigational - Study Medication Take 3 tablets by mouth daily. Additional Study Details: COSMOS CLINICAL TRIAL  4 tear trial , 2 years left  2 cocoa tablets and 1 Multivitamin /or placebo  Cocoa Extract, flavonoids and multivitamin    . polycarbophil (FIBERCON) 625 MG tablet Take 1,250 mg by mouth daily.    . vitamin B-12 (CYANOCOBALAMIN) 1000 MCG tablet Take 1,000 mcg by mouth daily.       No current facility-administered medications for this visit.     Review of Systems Review of Systems  Constitutional: Negative.   Respiratory: Negative.   Cardiovascular: Negative.   Gastrointestinal: Negative for constipation and diarrhea.    Blood pressure (!) 148/86, pulse Marland Kitchen)  58, resp. rate 11, height 5\' 6"  (1.676 m), weight 188 lb (85.3 kg).  Physical Exam Physical Exam  Constitutional: He is oriented to person, place, and time. He appears well-developed and well-nourished.  Abdominal: Normal appearance. No hernia.  Right hernia repair intact  Genitourinary:     Neurological: He is alert and oriented to person, place, and time.  Skin: Skin is warm and dry.  Psychiatric: His behavior is normal.       Assessment    Doing well post left inguinal hernia repair.     Plan         Proper lifting techniques reviewed. Resume activities as tolerated at the gym Follow up as needed.  HPI, Physical Exam, Assessment and Plan have been scribed under the direction and in the presence of Robert Bellow, MD. Karie Fetch, RN  I have completed  the exam and reviewed the above documentation for accuracy and completeness.  I agree with the above.  Haematologist has been used and any errors in dictation or transcription are unintentional.  Hervey Ard, M.D., F.A.C.S.  Robert Bellow 06/19/2017, 8:39 PM

## 2017-06-18 NOTE — Patient Instructions (Addendum)
The patient is aware to call back for any questions or concerns. Proper lifting techniques reviewed. Resume activities as tolerated at the gym.

## 2017-06-19 ENCOUNTER — Encounter: Payer: Self-pay | Admitting: General Surgery

## 2017-07-10 DIAGNOSIS — R69 Illness, unspecified: Secondary | ICD-10-CM | POA: Diagnosis not present

## 2017-10-20 DIAGNOSIS — L570 Actinic keratosis: Secondary | ICD-10-CM | POA: Diagnosis not present

## 2017-10-20 DIAGNOSIS — Z85828 Personal history of other malignant neoplasm of skin: Secondary | ICD-10-CM | POA: Diagnosis not present

## 2017-10-20 DIAGNOSIS — L820 Inflamed seborrheic keratosis: Secondary | ICD-10-CM | POA: Diagnosis not present

## 2017-10-20 DIAGNOSIS — Z08 Encounter for follow-up examination after completed treatment for malignant neoplasm: Secondary | ICD-10-CM | POA: Diagnosis not present

## 2017-10-20 DIAGNOSIS — L538 Other specified erythematous conditions: Secondary | ICD-10-CM | POA: Diagnosis not present

## 2017-12-22 ENCOUNTER — Ambulatory Visit (INDEPENDENT_AMBULATORY_CARE_PROVIDER_SITE_OTHER): Payer: Medicare HMO | Admitting: Internal Medicine

## 2017-12-22 ENCOUNTER — Encounter: Payer: Self-pay | Admitting: Internal Medicine

## 2017-12-22 VITALS — BP 140/84 | HR 45 | Temp 98.0°F | Wt 187.0 lb

## 2017-12-22 DIAGNOSIS — R3915 Urgency of urination: Secondary | ICD-10-CM | POA: Diagnosis not present

## 2017-12-22 DIAGNOSIS — R350 Frequency of micturition: Secondary | ICD-10-CM

## 2017-12-22 LAB — POC URINALSYSI DIPSTICK (AUTOMATED)
BILIRUBIN UA: NEGATIVE
GLUCOSE UA: NEGATIVE
Ketones, UA: NEGATIVE
Leukocytes, UA: NEGATIVE
Nitrite, UA: NEGATIVE
PH UA: 6 (ref 5.0–8.0)
Protein, UA: NEGATIVE
RBC UA: NEGATIVE
SPEC GRAV UA: 1.015 (ref 1.010–1.025)
Urobilinogen, UA: 0.2 E.U./dL

## 2017-12-22 NOTE — Patient Instructions (Signed)

## 2017-12-22 NOTE — Progress Notes (Signed)
HPI  Pt presents to the clinic today with c/o urinary urgency, and frequency. He reports this occurred 5 days ago, while driving. He denies dysuria, blood in his urine, nocturia, penile discharge or testicular pain. He denies fever, chills or low back pain. He is not having any symptoms at this time. He has no history of BPH. He has not taken anything OTC for his symptoms.    Review of Systems  Past Medical History:  Diagnosis Date  . Anemia    b12 deficiency  . Arthritis   . Cancer [facility])    SKIN CANCER  . Cataract    had surgery on both eyes  . GERD (gastroesophageal reflux disease)    NO MEDS  . Hx of adenomatous colonic polyps   . Hypertension    NO MEDS  . IBS (irritable bowel syndrome)   . Irregular heart beats   . Osteoporosis    knees  . Vitamin B12 deficiency    has responded to oral therapy    Family History  Problem Relation Age of Onset  . Hypertension Father   . Cancer Sister        neck  . Heart disease Neg Hx   . Diabetes Neg Hx   . Prostate cancer Neg Hx   . Colon cancer Neg Hx     Social History   Socioeconomic History  . Marital status: Divorced    Spouse name: Not on file  . Number of children: 1  . Years of education: Not on file  . Highest education level: Not on file  Occupational History  . Occupation: Retired    Comment: as Office manager  Social Needs  . Financial resource strain: Not on file  . Food insecurity:    Worry: Not on file    Inability: Not on file  . Transportation needs:    Medical: Not on file    Non-medical: Not on file  Tobacco Use  . Smoking status: Never Smoker  . Smokeless tobacco: Never Used  Substance and Sexual Activity  . Alcohol use: No  . Drug use: No  . Sexual activity: Not on file  Lifestyle  . Physical activity:    Days per week: Not on file    Minutes per session: Not on file  . Stress: Not on file  Relationships  . Social connections:    Talks on phone: Not on file    Gets together:  Not on file    Attends religious service: Not on file    Active member of club or organization: Not on file    Attends meetings of clubs or organizations: Not on file    Relationship status: Not on file  . Intimate partner violence:    Fear of current or ex partner: Not on file    Emotionally abused: Not on file    Physically abused: Not on file    Forced sexual activity: Not on file  Other Topics Concern  . Not on file  Social History Narrative   Has living will   [facility] friend, [name], is health care POA   Would accept resuscitation attempts    No feeding tube if cognitively unaware    No Known Allergies   Constitutional: Denies fever, malaise, fatigue, headache or abrupt weight changes.   GU: Pt reports urgency, frequency. Denies dysuria, burning sensation, blood in urine, odor or discharge. Skin: Denies redness, rashes, lesions or ulcercations.   No other specific complaints in a  complete review of systems (except as listed in HPI above).    Objective:   Physical Exam  BP 140/84   Pulse (!) 45   Temp 98 F (36.7 C) (Oral)   Wt 187 lb (84.8 kg)   SpO2 96%   BMI 30.18 kg/m  Wt Readings from Last 3 Encounters:  12/22/17 187 lb (84.8 kg)  06/18/17 188 lb (85.3 kg)  06/09/17 185 lb (83.9 kg)    General: Appears his stated age, well developed, well nourished in NAD. Abdomen: Soft, nontender. Normal bowel sounds. No distention or masses noted.No CVA tenderness.        Assessment & Plan:   Urgency, Frequency:  Doesn't sound c/w BPH Urinalysis: normal Not c/w infection He is not interested in treatment with Oxybutnin Drink plenty of fluids  RTC as needed or if symptoms persist. Webb Silversmith, NP

## 2018-01-12 DIAGNOSIS — R69 Illness, unspecified: Secondary | ICD-10-CM | POA: Diagnosis not present

## 2018-03-11 ENCOUNTER — Ambulatory Visit (INDEPENDENT_AMBULATORY_CARE_PROVIDER_SITE_OTHER): Payer: Medicare HMO | Admitting: Internal Medicine

## 2018-03-11 ENCOUNTER — Encounter: Payer: Self-pay | Admitting: Internal Medicine

## 2018-03-11 VITALS — BP 136/84 | HR 59 | Temp 98.0°F | Ht 64.5 in | Wt 185.0 lb

## 2018-03-11 DIAGNOSIS — Z7189 Other specified counseling: Secondary | ICD-10-CM | POA: Diagnosis not present

## 2018-03-11 DIAGNOSIS — M170 Bilateral primary osteoarthritis of knee: Secondary | ICD-10-CM

## 2018-03-11 DIAGNOSIS — Z0000 Encounter for general adult medical examination without abnormal findings: Secondary | ICD-10-CM

## 2018-03-11 DIAGNOSIS — N401 Enlarged prostate with lower urinary tract symptoms: Secondary | ICD-10-CM

## 2018-03-11 DIAGNOSIS — N138 Other obstructive and reflux uropathy: Secondary | ICD-10-CM | POA: Diagnosis not present

## 2018-03-11 DIAGNOSIS — I10 Essential (primary) hypertension: Secondary | ICD-10-CM

## 2018-03-11 DIAGNOSIS — E538 Deficiency of other specified B group vitamins: Secondary | ICD-10-CM | POA: Diagnosis not present

## 2018-03-11 LAB — COMPREHENSIVE METABOLIC PANEL
ALK PHOS: 65 U/L (ref 39–117)
ALT: 7 U/L (ref 0–53)
AST: 15 U/L (ref 0–37)
Albumin: 3.9 g/dL (ref 3.5–5.2)
BUN: 18 mg/dL (ref 6–23)
CALCIUM: 8.9 mg/dL (ref 8.4–10.5)
CO2: 30 mEq/L (ref 19–32)
Chloride: 104 mEq/L (ref 96–112)
Creatinine, Ser: 1.17 mg/dL (ref 0.40–1.50)
GFR: 64.04 mL/min (ref >60.00)
GLUCOSE: 83 mg/dL (ref 70–99)
POTASSIUM: 3.9 meq/L (ref 3.5–5.1)
Sodium: 139 mEq/L (ref 135–145)
TOTAL PROTEIN: 6.8 g/dL (ref 6.0–8.3)
Total Bilirubin: 0.6 mg/dL (ref 0.2–1.2)

## 2018-03-11 LAB — CBC
HEMATOCRIT: 39.6 % (ref 39.0–52.0)
Hemoglobin: 13.4 g/dL (ref 13.0–17.0)
MCHC: 33.8 g/dL (ref 30.0–36.0)
MCV: 98.2 fl (ref 78.0–100.0)
Platelets: 240 10*3/uL (ref 150.0–400.0)
RBC: 4.03 Mil/uL — ABNORMAL LOW (ref 4.22–5.81)
RDW: 14.5 % (ref 11.5–15.5)
WBC: 7 10*3/uL (ref 4.0–10.5)

## 2018-03-11 LAB — VITAMIN B12: VITAMIN B 12: 937 pg/mL — AB (ref 211–911)

## 2018-03-11 LAB — T4, FREE: FREE T4: 0.95 ng/dL (ref 0.60–1.60)

## 2018-03-11 MED ORDER — TRIAMCINOLONE ACETONIDE 0.1 % EX CREA: 1.0000 "application " | TOPICAL_CREAM | Freq: Two times a day (BID) | CUTANEOUS | 1 refills | Status: DC | PRN

## 2018-03-11 NOTE — Progress Notes (Signed)
Hearing Screening   Method: Audiometry   125Hz  250Hz  500Hz  1000Hz  2000Hz  3000Hz  4000Hz  6000Hz  8000Hz   Right ear:   20 20 20   0    Left ear:   20 20 20   0    Vision Screening Comments: October 2018

## 2018-03-11 NOTE — Assessment & Plan Note (Signed)
Does okay with the glucosamine/chondroitin

## 2018-03-11 NOTE — Assessment & Plan Note (Signed)
See social history 

## 2018-03-11 NOTE — Assessment & Plan Note (Signed)
Mild symptoms No Rx for now 

## 2018-03-11 NOTE — Progress Notes (Signed)
Subjective:    Patient ID: Todd Hodges, male    DOB: Nov 17, 1940, 77 y.o.   MRN: [id number]  HPI Here for Medicare wellness visit and follow up of chronic health conditions Reviewed form and advanced directives Reviewed other doctors No alcohol or tobacco Exercises regularly No falls Mild hearing loss--high frequency. Not ready for hearing aide Vision is okay--still gets starburst effect from headlights at night since cataract surgery (filtered glasses help) No depression or anhedonia Independent with instrumental ADLs Mild memory issues--- mostly names  Had hernia repair in December Still gets some twinge (from mesh) Doing well overall with this  Has spot he wants checked on his buttock (below coccyx) Comes and goes over months---has tried HC cream (calms it) Antifungals not helpful Wonders if using exercise machines at gym are causing  Having increased nocturia--up to 2 Flow is okay in day Rare urgency  No chest pain No SOB No dizziness or syncope No palpitations Does note earlier fatigue than 2 years ago--- like up and down ladder for 2+ hours wipes him out  Continues on glucosamine and chondroitin Has tried stopping and his knee pain will worsen Takes one a day Rare OTC acetaminophen  Continues on B12 No apparent neuropathy  Current Outpatient Medications on File Prior to Visit  Medication Sig Dispense Refill  . aspirin 81 MG tablet Take 81 mg by mouth every other day.     . fluorouracil (EFUDEX) 5 % cream Apply 1 application topically daily as needed (itching / rash).     . Glucosamine-Chondroit-Vit C-Mn (GLUCOSAMINE CHONDR 1500 COMPLX PO) Take 1 capsule by mouth daily.     . Investigational - Study Medication Take 3 tablets by mouth daily. Additional Study Details: COSMOS CLINICAL TRIAL  4 tear trial , 2 years left  2 cocoa tablets and 1 Multivitamin /or placebo  Cocoa Extract, flavonoids and multivitamin    . polyethylene glycol powder  (GLYCOLAX/MIRALAX) powder Take by mouth every other day. 2 teaspoons daily     . vitamin B-12 (CYANOCOBALAMIN) 1000 MCG tablet Take 1,000 mcg by mouth daily.       No current facility-administered medications on file prior to visit.     No Known Allergies  Past Medical History:  Diagnosis Date  . Anemia    b12 deficiency  . Arthritis   . Cancer [facility])    SKIN CANCER  . Cataract    had surgery on both eyes  . GERD (gastroesophageal reflux disease)    NO MEDS  . Hx of adenomatous colonic polyps   . Hypertension    NO MEDS  . IBS (irritable bowel syndrome)   . Irregular heart beats   . Osteoporosis    knees  . Vitamin B12 deficiency    has responded to oral therapy    Past Surgical History:  Procedure Laterality Date  . 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 CATARACT EXTRACTION  2010   Bilateral  . COLONOSCOPY  06/2016  . EYE SURGERY  07/08/2008   cataracts bilaterally  . INGUINAL HERNIA REPAIR Right 06/09/2017   Medium Ultra Pro mesh.;  Surgeon: Robert Bellow, MD;  Location: [facility] ORS;  Service: General;  Laterality: Right;  . POLYPECTOMY      Family History  Problem Relation Age of Onset  . Hypertension Father   . Cancer Sister        neck  . Heart disease Neg Hx   . Diabetes Neg  Hx   . Prostate cancer Neg Hx   . Colon cancer Neg Hx     Social History   Socioeconomic History  . Marital status: Divorced    Spouse name: Not on file  . Number of children: 1  . Years of education: Not on file  . Highest education level: Not on file  Occupational History  . Occupation: Retired    Comment: as Office manager  Social Needs  . Financial resource strain: Not on file  . Food insecurity:    Worry: Not on file    Inability: Not on file  . Transportation needs:    Medical: Not on file    Non-medical: Not on file  Tobacco Use  . Smoking status: Never Smoker  . Smokeless tobacco: Never Used  Substance and Sexual Activity  .  Alcohol use: No  . Drug use: No  . Sexual activity: Not on file  Lifestyle  . Physical activity:    Days per week: Not on file    Minutes per session: Not on file  . Stress: Not on file  Relationships  . Social connections:    Talks on phone: Not on file    Gets together: Not on file    Attends religious service: Not on file    Active member of club or organization: Not on file    Attends meetings of clubs or organizations: Not on file    Relationship status: Not on file  . Intimate partner violence:    Fear of current or ex partner: Not on file    Emotionally abused: Not on file    Physically abused: Not on file    Forced sexual activity: Not on file  Other Topics Concern  . Not on file  Social History Narrative   Has living will   [facility] friend, [name], is health care POA   Would accept resuscitation attempts    No feeding tube if cognitively unaware   Review of Systems Did have mole removed from abdomen last year--- no other skin problems Appetite is good Weight is stable Sleeps fairly well Bowels still slow---adjusting the miralax dosing (full cap every other day seems right). No blood Wears seat belt Teeth are fine---keeps up with dentist No sig heartburn or dysphagia    Objective:   Physical Exam  Constitutional: He is oriented to person, place, and time. He appears well-developed. No distress.  HENT:  Mouth/Throat: Oropharynx is clear and moist. No oropharyngeal exudate.  Neck: No thyromegaly present.  Cardiovascular: Normal rate, regular rhythm, normal heart sounds and intact distal pulses. Exam reveals no gallop.  No murmur heard. Respiratory: Effort normal and breath sounds normal. No respiratory distress. He has no wheezes. He has no rales.  GI: Soft. There is no tenderness.  Musculoskeletal: He exhibits no edema or tenderness.  Lymphadenopathy:    He has no cervical adenopathy.  Neurological: He is alert and oriented to person, place, and time.    President--- "Dwaine Deter, Bush" [phone] D-l-r-o-w Recall 3/3  Skin: No rash noted. No erythema.  Mild irritation along crack of buttock--no ulcer  Psychiatric: He has a normal mood and affect. His behavior is normal.           Assessment & Plan:

## 2018-03-11 NOTE — Assessment & Plan Note (Signed)
BP Readings from Last 3 Encounters:  03/11/18 136/84  12/22/17 140/84  06/18/17 (!) 148/86   Still fine without Rx

## 2018-03-11 NOTE — Assessment & Plan Note (Signed)
I have personally reviewed the Medicare Annual Wellness questionnaire and have noted 1. The patient's medical and social history 2. Their use of alcohol, tobacco or illicit drugs 3. Their current medications and supplements 4. The patient's functional ability including ADL's, fall risks, home safety risks and hearing or visual             impairment. 5. Diet and physical activities 6. Evidence for depression or mood disorders  The patients weight, height, BMI and visual acuity have been recorded in the chart I have made referrals, counseling and provided education to the patient based review of the above and I have provided the pt with a written personalized care plan for preventive services.  I have provided you with a copy of your personalized plan for preventive services. Please take the time to review along with your updated medication list.  Fairly healthy Discussed fitness Prefers no flu vaccine or shingrix Done with cancer screening

## 2018-03-11 NOTE — Assessment & Plan Note (Signed)
No symptoms Will check level given his mild fatigue

## 2018-07-16 ENCOUNTER — Ambulatory Visit (INDEPENDENT_AMBULATORY_CARE_PROVIDER_SITE_OTHER): Payer: Medicare HMO | Admitting: Internal Medicine

## 2018-07-16 ENCOUNTER — Encounter: Payer: Self-pay | Admitting: Internal Medicine

## 2018-07-16 VITALS — BP 134/84 | HR 59 | Temp 98.0°F | Ht 64.5 in | Wt 186.0 lb

## 2018-07-16 DIAGNOSIS — N138 Other obstructive and reflux uropathy: Secondary | ICD-10-CM | POA: Diagnosis not present

## 2018-07-16 DIAGNOSIS — N401 Enlarged prostate with lower urinary tract symptoms: Secondary | ICD-10-CM | POA: Diagnosis not present

## 2018-07-16 DIAGNOSIS — R69 Illness, unspecified: Secondary | ICD-10-CM | POA: Diagnosis not present

## 2018-07-16 LAB — POC URINALSYSI DIPSTICK (AUTOMATED)
Bilirubin, UA: NEGATIVE
Blood, UA: NEGATIVE
Glucose, UA: NEGATIVE
Ketones, UA: NEGATIVE
Leukocytes, UA: NEGATIVE
NITRITE UA: NEGATIVE
PH UA: 5.5 (ref 5.0–8.0)
PROTEIN UA: NEGATIVE
Spec Grav, UA: 1.02 (ref 1.010–1.025)
UROBILINOGEN UA: 0.2 U/dL

## 2018-07-16 MED ORDER — TAMSULOSIN HCL 0.4 MG PO CAPS: 0.4000 mg | ORAL_CAPSULE | Freq: Every day | ORAL | 11 refills | Status: DC

## 2018-07-16 NOTE — Patient Instructions (Signed)
Try the tamsulosin for the next month. If you are not satisfied, I will switch you.

## 2018-07-16 NOTE — Progress Notes (Signed)
Subjective:    Patient ID: Todd Hodges, male    DOB: [birth year], 78 y.o.   MRN: [id number]  HPI Here due to ongoing urinary symptoms  Mostly has frequency and urgency Goes back to July that he remembers Day and night Has to void 10 times a day usually---and often low volume Slight sensation when voiding---"but nothing remarkable" Urgency is quite bad---with some leakage No blood in urine  Current Outpatient Medications on File Prior to Visit  Medication Sig Dispense Refill  . aspirin 81 MG tablet Take 81 mg by mouth every other day.     . fluorouracil (EFUDEX) 5 % cream Apply 1 application topically daily as needed (itching / rash).     . Glucosamine-Chondroit-Vit C-Mn (GLUCOSAMINE CHONDR 1500 COMPLX PO) Take 1 capsule by mouth daily.     . Investigational - Study Medication Take 3 tablets by mouth daily. Additional Study Details: COSMOS CLINICAL TRIAL  4 year trial , 1 year left  2 cocoa tablets and 1 Multivitamin /or placebo  Cocoa Extract, flavonoids and multivitamin    . Methylcellulose, Laxative, (CITRUCEL PO) Take by mouth.    . triamcinolone cream (KENALOG) 0.1 % Apply 1 application topically 2 (two) times daily as needed. 45 g 1  . vitamin B-12 (CYANOCOBALAMIN) 1000 MCG tablet Take 1,000 mcg by mouth daily.       No current facility-administered medications on file prior to visit.     No Known Allergies  Past Medical History:  Diagnosis Date  . Anemia    b12 deficiency  . Arthritis   . Cancer [facility])    SKIN CANCER  . Cataract    had surgery on both eyes  . GERD (gastroesophageal reflux disease)    NO MEDS  . Hx of adenomatous colonic polyps   . Hypertension    NO MEDS  . IBS (irritable bowel syndrome)   . Irregular heart beats   . Osteoporosis    knees  . Vitamin B12 deficiency    has responded to oral therapy    Past Surgical History:  Procedure Laterality Date  . 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 CATARACT  EXTRACTION  2010   Bilateral  . COLONOSCOPY  06/2016  . EYE SURGERY  07/08/2008   cataracts bilaterally  . INGUINAL HERNIA REPAIR Right 06/09/2017   Medium Ultra Pro mesh.;  Surgeon: Robert Bellow, MD;  Location: [facility] ORS;  Service: General;  Laterality: Right;  . POLYPECTOMY      Family History  Problem Relation Age of Onset  . Hypertension Father   . Cancer Sister        neck  . Heart disease Neg Hx   . Diabetes Neg Hx   . Prostate cancer Neg Hx   . Colon cancer Neg Hx     Social History   Socioeconomic History  . Marital status: Divorced    Spouse name: Not on file  . Number of children: 1  . Years of education: Not on file  . Highest education level: Not on file  Occupational History  . Occupation: Retired    Comment: as Office manager  Social Needs  . Financial resource strain: Not on file  . Food insecurity:    Worry: Not on file    Inability: Not on file  . Transportation needs:    Medical: Not on file    Non-medical: Not on file  Tobacco Use  . Smoking status:  Never Smoker  . Smokeless tobacco: Never Used  Substance and Sexual Activity  . Alcohol use: No  . Drug use: No  . Sexual activity: Not on file  Lifestyle  . Physical activity:    Days per week: Not on file    Minutes per session: Not on file  . Stress: Not on file  Relationships  . Social connections:    Talks on phone: Not on file    Gets together: Not on file    Attends religious service: Not on file    Active member of club or organization: Not on file    Attends meetings of clubs or organizations: Not on file    Relationship status: Not on file  . Intimate partner violence:    Fear of current or ex partner: Not on file    Emotionally abused: Not on file    Physically abused: Not on file    Forced sexual activity: Not on file  Other Topics Concern  . Not on file  Social History Narrative   Has living will   [facility] friend, [name], is health care POA   Would accept  resuscitation attempts    No feeding tube if cognitively unaware   Review of Systems Changed to citrucel from miralax---he wanted to make sure that wasn't causing some of the problems. Likes this better for his bowels (not as runny) No fever    Objective:   Physical Exam  Constitutional: He appears well-developed. No distress.  GI: Soft. He exhibits no distension. There is no abdominal tenderness. There is no rebound and no guarding.  No suprapubic dullness           Assessment & Plan:

## 2018-07-16 NOTE — Assessment & Plan Note (Addendum)
Now is bothered by this Urinalysis is negative Will try tamsulosin If not satisfied---will switch to finasteride

## 2019-01-13 DIAGNOSIS — R1084 Generalized abdominal pain: Secondary | ICD-10-CM

## 2019-01-14 ENCOUNTER — Other Ambulatory Visit: Payer: Self-pay

## 2019-01-14 ENCOUNTER — Other Ambulatory Visit (INDEPENDENT_AMBULATORY_CARE_PROVIDER_SITE_OTHER): Payer: Medicare HMO

## 2019-01-14 DIAGNOSIS — R1084 Generalized abdominal pain: Secondary | ICD-10-CM | POA: Diagnosis not present

## 2019-01-14 NOTE — Addendum Note (Signed)
Addended by: Ellamae Sia on: 01/14/2019 02:27 PM   Modules accepted: Orders

## 2019-01-14 NOTE — Telephone Encounter (Signed)
Please set up time for him to have his blood work drawn

## 2019-01-20 LAB — CELIAC PNL 2 RFLX ENDOMYSIAL AB TTR
(tTG) Ab, IgA: <1 U/mL
(tTG) Ab, IgG: 3 U/mL
Endomysial Ab IgA: NEGATIVE
Gliadin IgA: 6 U (ref <20)
Gliadin IgG: 1 U (ref <20)
Immunoglobulin A: 288 mg/dL (ref 70–320)

## 2019-02-05 DIAGNOSIS — L608 Other nail disorders: Secondary | ICD-10-CM | POA: Diagnosis not present

## 2019-02-05 DIAGNOSIS — X32XXXA Exposure to sunlight, initial encounter: Secondary | ICD-10-CM | POA: Diagnosis not present

## 2019-02-05 DIAGNOSIS — L538 Other specified erythematous conditions: Secondary | ICD-10-CM | POA: Diagnosis not present

## 2019-02-05 DIAGNOSIS — L570 Actinic keratosis: Secondary | ICD-10-CM | POA: Diagnosis not present

## 2019-02-05 DIAGNOSIS — D485 Neoplasm of uncertain behavior of skin: Secondary | ICD-10-CM | POA: Diagnosis not present

## 2019-02-05 DIAGNOSIS — L820 Inflamed seborrheic keratosis: Secondary | ICD-10-CM | POA: Diagnosis not present

## 2019-02-05 DIAGNOSIS — Z85828 Personal history of other malignant neoplasm of skin: Secondary | ICD-10-CM | POA: Diagnosis not present

## 2019-02-05 DIAGNOSIS — Z08 Encounter for follow-up examination after completed treatment for malignant neoplasm: Secondary | ICD-10-CM | POA: Diagnosis not present

## 2019-02-08 DIAGNOSIS — R69 Illness, unspecified: Secondary | ICD-10-CM | POA: Diagnosis not present

## 2019-02-15 DIAGNOSIS — L570 Actinic keratosis: Secondary | ICD-10-CM | POA: Diagnosis not present

## 2019-03-05 ENCOUNTER — Encounter: Payer: Self-pay | Admitting: Internal Medicine

## 2019-03-05 DIAGNOSIS — Z0279 Encounter for issue of other medical certificate: Secondary | ICD-10-CM

## 2019-03-05 NOTE — Telephone Encounter (Signed)
I spoke to patient and he requested letter be mailed and patient's aware of $20 charge.

## 2019-03-05 NOTE — Telephone Encounter (Signed)
Letter written $20 charge 

## 2019-03-16 ENCOUNTER — Encounter: Payer: Medicare HMO | Admitting: Internal Medicine

## 2019-03-17 ENCOUNTER — Encounter: Payer: Self-pay | Admitting: Internal Medicine

## 2019-03-17 ENCOUNTER — Other Ambulatory Visit: Payer: Self-pay

## 2019-03-17 ENCOUNTER — Ambulatory Visit (INDEPENDENT_AMBULATORY_CARE_PROVIDER_SITE_OTHER): Payer: Medicare HMO | Admitting: Internal Medicine

## 2019-03-17 VITALS — BP 132/82 | HR 52 | Temp 97.4°F | Ht 64.5 in | Wt 183.0 lb

## 2019-03-17 DIAGNOSIS — N138 Other obstructive and reflux uropathy: Secondary | ICD-10-CM | POA: Diagnosis not present

## 2019-03-17 DIAGNOSIS — K5909 Other constipation: Secondary | ICD-10-CM | POA: Insufficient documentation

## 2019-03-17 DIAGNOSIS — N401 Enlarged prostate with lower urinary tract symptoms: Secondary | ICD-10-CM | POA: Diagnosis not present

## 2019-03-17 DIAGNOSIS — Z7189 Other specified counseling: Secondary | ICD-10-CM | POA: Diagnosis not present

## 2019-03-17 DIAGNOSIS — M170 Bilateral primary osteoarthritis of knee: Secondary | ICD-10-CM

## 2019-03-17 DIAGNOSIS — E538 Deficiency of other specified B group vitamins: Secondary | ICD-10-CM

## 2019-03-17 DIAGNOSIS — Z0000 Encounter for general adult medical examination without abnormal findings: Secondary | ICD-10-CM

## 2019-03-17 DIAGNOSIS — I10 Essential (primary) hypertension: Secondary | ICD-10-CM | POA: Diagnosis not present

## 2019-03-17 LAB — COMPREHENSIVE METABOLIC PANEL
ALT: 9 U/L (ref 0–53)
AST: 17 U/L (ref 0–37)
Albumin: 3.9 g/dL (ref 3.5–5.2)
Alkaline Phosphatase: 78 U/L (ref 39–117)
BUN: 16 mg/dL (ref 6–23)
CO2: 27 mEq/L (ref 19–32)
Calcium: 8.7 mg/dL (ref 8.4–10.5)
Chloride: 105 mEq/L (ref 96–112)
Creatinine, Ser: 1.06 mg/dL (ref 0.40–1.50)
GFR: 67.34 mL/min (ref >60.00)
Glucose, Bld: 87 mg/dL (ref 70–99)
Potassium: 3.9 mEq/L (ref 3.5–5.1)
Sodium: 139 mEq/L (ref 135–145)
Total Bilirubin: 0.8 mg/dL (ref 0.2–1.2)
Total Protein: 6.5 g/dL (ref 6.0–8.3)

## 2019-03-17 LAB — CBC
HCT: 39.1 % (ref 39.0–52.0)
Hemoglobin: 13.2 g/dL (ref 13.0–17.0)
MCHC: 33.8 g/dL (ref 30.0–36.0)
MCV: 99.4 fl (ref 78.0–100.0)
Platelets: 229 10*3/uL (ref 150.0–400.0)
RBC: 3.94 Mil/uL — ABNORMAL LOW (ref 4.22–5.81)
RDW: 14.5 % (ref 11.5–15.5)
WBC: 5.9 10*3/uL (ref 4.0–10.5)

## 2019-03-17 LAB — VITAMIN B12: Vitamin B-12: 1237 pg/mL — ABNORMAL HIGH (ref 211–911)

## 2019-03-17 NOTE — Progress Notes (Signed)
Subjective:    Patient ID: Todd Hodges, male    DOB: [birth year], 78 y.o.   MRN: [id number]  HPI Here for Medicare wellness and follow up of chronic health conditions Reviewed form and advanced directives Reviewed other doctors No alcohol or tobacco Exercises fairly regularly--but [facility] Vision is fair---still gets starbursts from lights at night since cataracts (has filtered glasses that help) Hearing is okay No falls No depression or anhedonia---though limited things to do Independent with instrumental ADLs Memory is okay--mild issues (mostly names)  Did try the tamsulosin--didn't seem to help much Still frequent nocturia but not as bad Feels it may be related to intestinal pressure Will have episodic urgency (but is home mostly so not a big deal)  Still has the digestive problems---chronic constipation Takes the miralax-- 1 teaspoon bid Gives him gas but it does work  Ongoing arthritis Still on glucosamine and chondroitin  Variable depending on what he does No other meds  Continues on the every other day ASA Strong FH of strokes He does note easier bleeding on this though  No chest pain or SOB No dizziness or syncope BP was elevated for a while back in January--relates to digestive problems Settled bowels down---and BP back to normal No edema No headaches  Still on the vitamin B12  Current Outpatient Medications on File Prior to Visit  Medication Sig Dispense Refill  . aspirin 81 MG tablet Take 81 mg by mouth every other day.     . fluorouracil (EFUDEX) 5 % cream Apply 1 application topically daily as needed (itching / rash).     . Glucosamine-Chondroit-Vit C-Mn (GLUCOSAMINE CHONDR 1500 COMPLX PO) Take 1 capsule by mouth daily.     . Investigational - Study Medication Take 3 tablets by mouth daily. Additional Study Details: COSMOS CLINICAL TRIAL  4 year trial , 1 year left  2 cocoa tablets and 1 Multivitamin /or placebo  Cocoa  Extract, flavonoids and multivitamin    . polyethylene glycol (MIRALAX / GLYCOLAX) 17 g packet Take 5 g by mouth 2 (two) times daily.    Marland Kitchen triamcinolone cream (KENALOG) 0.1 % Apply 1 application topically 2 (two) times daily as needed. 45 g 1  . vitamin B-12 (CYANOCOBALAMIN) 1000 MCG tablet Take 1,000 mcg by mouth daily.       No current facility-administered medications on file prior to visit.     No Known Allergies  Past Medical History:  Diagnosis Date  . Anemia    b12 deficiency  . Arthritis   . Cancer [facility])    SKIN CANCER  . Cataract    had surgery on both eyes  . GERD (gastroesophageal reflux disease)    NO MEDS  . Hx of adenomatous colonic polyps   . Hypertension    NO MEDS  . IBS (irritable bowel syndrome)   . Irregular heart beats   . Osteoporosis    knees  . Vitamin B12 deficiency    has responded to oral therapy    Past Surgical History:  Procedure Laterality Date  . 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 CATARACT EXTRACTION  2010   Bilateral  . COLONOSCOPY  06/2016  . EYE SURGERY  07/08/2008   cataracts bilaterally  . INGUINAL HERNIA REPAIR Right 06/09/2017   Medium Ultra Pro mesh.;  Surgeon: Robert Bellow, MD;  Location: [facility] ORS;  Service: General;  Laterality: Right;  . POLYPECTOMY  Family History  Problem Relation Age of Onset  . Hypertension Father   . Cancer Sister        neck  . Heart disease Neg Hx   . Diabetes Neg Hx   . Prostate cancer Neg Hx   . Colon cancer Neg Hx     Social History   Socioeconomic History  . Marital status: Divorced    Spouse name: Not on file  . Number of children: 1  . Years of education: Not on file  . Highest education level: Not on file  Occupational History  . Occupation: Retired    Comment: as Office manager  Social Needs  . Financial resource strain: Not on file  . Food insecurity    Worry: Not on file    Inability: Not on file  . Transportation needs     Medical: Not on file    Non-medical: Not on file  Tobacco Use  . Smoking status: Never Smoker  . Smokeless tobacco: Never Used  Substance and Sexual Activity  . Alcohol use: No  . Drug use: No  . Sexual activity: Not on file  Lifestyle  . Physical activity    Days per week: Not on file    Minutes per session: Not on file  . Stress: Not on file  Relationships  . Social Herbalist on phone: Not on file    Gets together: Not on file    Attends religious service: Not on file    Active member of club or organization: Not on file    Attends meetings of clubs or organizations: Not on file    Relationship status: Not on file  . Intimate partner violence    Fear of current or ex partner: Not on file    Emotionally abused: Not on file    Physically abused: Not on file    Forced sexual activity: Not on file  Other Topics Concern  . Not on file  Social History Narrative   Has living will   [facility] friend, [name], is health care POA   Would accept resuscitation attempts    No feeding tube if cognitively unaware   Review of Systems Appetite is fine Weight is stable Generally sleeps okay--nocturia x 2 usually Wears seat belt Teeth are fine--keeps up with dentist Recent derm visit--got 5-FU again for his head No heartburn or dysphagia No blood in stools     Objective:   Physical Exam  Constitutional: He is oriented to person, place, and time. He appears well-developed. No distress.  HENT:  Mouth/Throat: Oropharynx is clear and moist. No oropharyngeal exudate.  Neck: No thyromegaly present.  Cardiovascular: Normal rate, regular rhythm, normal heart sounds and intact distal pulses. Exam reveals no gallop.  No murmur heard. occ skipped beat  Respiratory: Effort normal and breath sounds normal. No respiratory distress. He has no wheezes. He has no rales.  GI: Soft. There is no abdominal tenderness.  Musculoskeletal:        General: No tenderness or edema.   Lymphadenopathy:    He has no cervical adenopathy.  Neurological: He is alert and oriented to person, place, and time.  President--- "Gerarda Fraction Trump, Obama, Bush" [phone] D-l-r-o-w Recall 3/3  Skin: No rash noted. No erythema.  Psychiatric: He has a normal mood and affect. His behavior is normal.           Assessment & Plan:

## 2019-03-17 NOTE — Assessment & Plan Note (Signed)
Better If worsens, will try finasteride

## 2019-03-17 NOTE — Assessment & Plan Note (Signed)
BP Readings from Last 3 Encounters:  03/17/19 132/82  07/16/18 134/84  03/11/18 136/84   Did have spell of high pressures a while back Generally okay without Rx

## 2019-03-17 NOTE — Assessment & Plan Note (Signed)
I have personally reviewed the Medicare Annual Wellness questionnaire and have noted 1. The patient's medical and social history 2. Their use of alcohol, tobacco or illicit drugs 3. Their current medications and supplements 4. The patient's functional ability including ADL's, fall risks, home safety risks and hearing or visual             impairment. 5. Diet and physical activities 6. Evidence for depression or mood disorders  The patients weight, height, BMI and visual acuity have been recorded in the chart I have made referrals, counseling and provided education to the patient based review of the above and I have provided the pt with a written personalized care plan for preventive services.  I have provided you with a copy of your personalized plan for preventive services. Please take the time to review along with your updated medication list.  No more cancer screening Continues to exercise regularly [facility] Prefers no flu vaccine or shingrix Will defer Td till he has an injury

## 2019-03-17 NOTE — Assessment & Plan Note (Signed)
See social history 

## 2019-03-17 NOTE — Assessment & Plan Note (Signed)
miralax helps but gives gas Discussed trying low dose senna

## 2019-03-17 NOTE — Assessment & Plan Note (Signed)
Will check levels on supplement

## 2019-03-17 NOTE — Assessment & Plan Note (Signed)
Does okay with the glucosamine/chondroitin

## 2019-03-17 NOTE — Patient Instructions (Signed)
You can consider trying senna-s 1-2 tabs daily to see if that helps your bowels.

## 2019-04-05 DIAGNOSIS — R69 Illness, unspecified: Secondary | ICD-10-CM | POA: Diagnosis not present

## 2019-06-15 ENCOUNTER — Other Ambulatory Visit: Payer: Self-pay

## 2019-06-15 ENCOUNTER — Encounter: Payer: Self-pay | Admitting: Internal Medicine

## 2019-06-15 ENCOUNTER — Ambulatory Visit (INDEPENDENT_AMBULATORY_CARE_PROVIDER_SITE_OTHER): Payer: Medicare HMO | Admitting: Internal Medicine

## 2019-06-15 DIAGNOSIS — K581 Irritable bowel syndrome with constipation: Secondary | ICD-10-CM | POA: Diagnosis not present

## 2019-06-15 NOTE — Progress Notes (Signed)
Subjective:    Patient ID: Todd Hodges, male    DOB: Nov 07, 1940, 78 y.o.   MRN: [id number]  HPI Here due to new bulge in groin  This visit occurred during the SARS-CoV-2 public health emergency.  Safety protocols were in place, including screening questions prior to the visit, additional usage of staff PPE, and extensive cleaning of exam room while observing appropriate contact time as indicated for disinfecting solutions.   Left groin bulge--got really bad last week Now it seems to be gone He feels like it is a big gas bubble Pressure and bulge seem to come on about an hour after eating Will push on it and then pass gas  Having trouble with gas--in the past couple of months Thinks it is related to miralax--- decreased to 1 teaspoon daily now Bowels are regular  No sugar free lozenges, etc Fair amount of dairy--doesn't think lactaid helped at all (or dairy restriction)  Current Outpatient Medications on File Prior to Visit  Medication Sig Dispense Refill  . aspirin 81 MG tablet Take 81 mg by mouth every other day.     . fluorouracil (EFUDEX) 5 % cream Apply 1 application topically daily as needed (itching / rash).     . Glucosamine-Chondroit-Vit C-Mn (GLUCOSAMINE CHONDR 1500 COMPLX PO) Take 2 capsules by mouth daily.     . Investigational - Study Medication Take 3 tablets by mouth daily. Additional Study Details: COSMOS CLINICAL TRIAL  4 year trial , 1 year left  2 cocoa tablets and 1 Multivitamin /or placebo  Cocoa Extract, flavonoids and multivitamin    . polyethylene glycol (MIRALAX / GLYCOLAX) 17 g packet Take 5 g by mouth daily. Pt states he is taking 1/2 teaspoon.    . triamcinolone cream (KENALOG) 0.1 % Apply 1 application topically 2 (two) times daily as needed. 45 g 1  . vitamin B-12 (CYANOCOBALAMIN) 1000 MCG tablet Take 1,000 mcg by mouth daily.       No current facility-administered medications on file prior to visit.     No Known Allergies  Past Medical  History:  Diagnosis Date  . Anemia    b12 deficiency  . Arthritis   . Cancer [facility])    SKIN CANCER  . Cataract    had surgery on both eyes  . GERD (gastroesophageal reflux disease)    NO MEDS  . Hx of adenomatous colonic polyps   . Hypertension    NO MEDS  . IBS (irritable bowel syndrome)   . Irregular heart beats   . Osteoporosis    knees  . Vitamin B12 deficiency    has responded to oral therapy    Past Surgical History:  Procedure Laterality Date  . 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 CATARACT EXTRACTION  2010   Bilateral  . COLONOSCOPY  06/2016  . EYE SURGERY  07/08/2008   cataracts bilaterally  . INGUINAL HERNIA REPAIR Right 06/09/2017   Medium Ultra Pro mesh.;  Surgeon: Robert Bellow, MD;  Location: [facility] ORS;  Service: General;  Laterality: Right;  . POLYPECTOMY      Family History  Problem Relation Age of Onset  . Hypertension Father   . Cancer Sister        neck  . Heart disease Neg Hx   . Diabetes Neg Hx   . Prostate cancer Neg Hx   . Colon cancer Neg Hx     Social History   Socioeconomic History  .  Marital status: Divorced    Spouse name: Not on file  . Number of children: 1  . Years of education: Not on file  . Highest education level: Not on file  Occupational History  . Occupation: Retired    Comment: as Office manager  Social Needs  . Financial resource strain: Not on file  . Food insecurity    Worry: Not on file    Inability: Not on file  . Transportation needs    Medical: Not on file    Non-medical: Not on file  Tobacco Use  . Smoking status: Never Smoker  . Smokeless tobacco: Never Used  Substance and Sexual Activity  . Alcohol use: No  . Drug use: No  . Sexual activity: Not on file  Lifestyle  . Physical activity    Days per week: Not on file    Minutes per session: Not on file  . Stress: Not on file  Relationships  . Social Herbalist on phone: Not on file    Gets together:  Not on file    Attends religious service: Not on file    Active member of club or organization: Not on file    Attends meetings of clubs or organizations: Not on file    Relationship status: Not on file  . Intimate partner violence    Fear of current or ex partner: Not on file    Emotionally abused: Not on file    Physically abused: Not on file    Forced sexual activity: Not on file  Other Topics Concern  . Not on file  Social History Narrative   Has living will   [facility] friend, [name], is health care POA   Would accept resuscitation attempts    No feeding tube if cognitively unaware   Review of Systems  Appetite is fine No nausea or vomiting     Objective:   Physical Exam  Constitutional: He appears well-developed. No distress.  GI: Soft. Bowel sounds are normal. He exhibits no distension and no mass. There is no abdominal tenderness. There is no rebound and no guarding.  No hernia Abnormal area for him is above the inguinal ring on the left           Assessment & Plan:

## 2019-06-15 NOTE — Assessment & Plan Note (Signed)
With constipation Sounds like he gets gas after eating Chronic constipation Reduced miralax helps bowels---higher dose does cause gas, but not clear if lower dose does Senna-s no help for bowels Wonders about linzess--will consider

## 2019-07-20 DIAGNOSIS — K4090 Unilateral inguinal hernia, without obstruction or gangrene, not specified as recurrent: Secondary | ICD-10-CM | POA: Diagnosis not present

## 2019-07-21 ENCOUNTER — Other Ambulatory Visit: Payer: Self-pay | Admitting: General Surgery

## 2019-07-22 ENCOUNTER — Other Ambulatory Visit
Admission: RE | Admit: 2019-07-22 | Discharge: 2019-07-22 | Disposition: A | Discharge status: Home / Self Care | Payer: Medicare HMO | Source: Ambulatory Visit | Attending: General Surgery | Admitting: General Surgery

## 2019-07-22 DIAGNOSIS — Z01812 Encounter for preprocedural laboratory examination: Secondary | ICD-10-CM | POA: Insufficient documentation

## 2019-07-22 DIAGNOSIS — Z20822 Contact with and (suspected) exposure to covid-19: Secondary | ICD-10-CM | POA: Insufficient documentation

## 2019-07-22 LAB — SARS CORONAVIRUS 2 (TAT 6-24 HRS): SARS Coronavirus 2: NEGATIVE

## 2019-07-23 ENCOUNTER — Encounter
Admission: RE | Admit: 2019-07-23 | Discharge: 2019-07-23 | Disposition: A | Discharge status: Home / Self Care | Payer: Medicare HMO | Source: Ambulatory Visit | Attending: General Surgery | Admitting: General Surgery

## 2019-07-23 ENCOUNTER — Other Ambulatory Visit: Payer: Self-pay

## 2019-07-23 HISTORY — DX: Unspecified hemorrhoids: K64.9

## 2019-07-23 NOTE — Patient Instructions (Signed)
Your procedure is scheduled on: Monday 1/18 Report to Day Surgery.[facility] To find out your arrival time please call [phone] between 1PM - 3PM on TODAY  Remember: Instructions that are not followed completely may result in serious medical risk,  up to and including death, or upon the discretion of your surgeon and anesthesiologist your  surgery may need to be rescheduled.     _X__ 1. Do not eat food after midnight the night before your procedure.                 No gum chewing or hard candies. You may drink clear liquids up to 2 hours                 before you are scheduled to arrive for your surgery- DO not drink clear                 liquids within 2 hours of the start of your surgery.                 Clear Liquids include:  water, apple juice without pulp, clear carbohydrate                 drink such as Clearfast of Gatorade, Black Coffee or Tea (Do not add                 anything to coffee or tea).  __X__2.  On the morning of surgery brush your teeth with toothpaste and water, you                may rinse your mouth with mouthwash if you wish.  Do not swallow any toothpaste of mouthwash.     _X__ 3.  No Alcohol for 24 hours before or after surgery.   ___ 4.  Do Not Smoke or use e-cigarettes For 24 Hours Prior to Your Surgery.                 Do not use any chewable tobacco products for at least 6 hours prior to                 surgery.  ____  5.  Bring all medications with you on the day of surgery if instructed.   X___  6.  Notify your doctor if there is any change in your medical condition      (cold, fever, infections).     Do not wear jewelry, make-up, hairpins, clips or nail polish. Do not wear lotions, powders, or perfumes. You may wear deodorant. Do not shave 48 hours prior to surgery. Men may shave face and neck. Do not bring valuables to the hospital.    [facility] is not responsible for any belongings or valuables.  Contacts,  dentures or bridgework may not be worn into surgery. Leave your suitcase in the car. After surgery it may be brought to your room. For patients admitted to the hospital, discharge time is determined by your treatment team.   Patients discharged the day of surgery will not be allowed to drive home.   Please read over the following fact sheets that you were given:    ____ Take these medicines the morning of surgery with A SIP OF WATER:    1. NONE  2.   3.   4.  5.  6.  ____ Fleet Enema (as directed)   __X__ SHOWER THE NIGHT BEFORE AND THE MORNING OF SURGERY  ____ Use inhalers  on the day of surgery  ____ Stop metformin 2 days prior to surgery    ____ Take 1/2 of usual insulin dose the night before surgery. No insulin the morning          of surgery.   ____ Stop Coumadin/Plavix/aspirin on  _X___ Stop Anti-inflammatories NO IBUPROFEN ALEVE OR ASPIRIN PRODUCTS    MAY TAKE TYLENOL   __X__ Stop supplements until after surgery.  Glucosamine Sulfate 750 MG TABS  ____ Bring C-Pap to the hospital.

## 2019-07-26 ENCOUNTER — Ambulatory Visit: Payer: Medicare HMO | Admitting: Anesthesiology

## 2019-07-26 ENCOUNTER — Encounter: Payer: Self-pay | Admitting: General Surgery

## 2019-07-26 ENCOUNTER — Other Ambulatory Visit: Payer: Self-pay

## 2019-07-26 ENCOUNTER — Ambulatory Visit
Admission: RE | Admit: 2019-07-26 | Discharge: 2019-07-26 | Disposition: A | Discharge status: Home / Self Care | Payer: Medicare HMO | Attending: General Surgery | Admitting: General Surgery

## 2019-07-26 ENCOUNTER — Encounter
Admission: RE | Disposition: A | Discharge status: Home / Self Care | Payer: Self-pay | Source: Home / Self Care | Attending: General Surgery

## 2019-07-26 DIAGNOSIS — K4090 Unilateral inguinal hernia, without obstruction or gangrene, not specified as recurrent: Secondary | ICD-10-CM | POA: Insufficient documentation

## 2019-07-26 DIAGNOSIS — Z79899 Other long term (current) drug therapy: Secondary | ICD-10-CM | POA: Insufficient documentation

## 2019-07-26 DIAGNOSIS — Z85828 Personal history of other malignant neoplasm of skin: Secondary | ICD-10-CM | POA: Insufficient documentation

## 2019-07-26 DIAGNOSIS — K219 Gastro-esophageal reflux disease without esophagitis: Secondary | ICD-10-CM | POA: Diagnosis not present

## 2019-07-26 DIAGNOSIS — I10 Essential (primary) hypertension: Secondary | ICD-10-CM | POA: Diagnosis not present

## 2019-07-26 DIAGNOSIS — M1990 Unspecified osteoarthritis, unspecified site: Secondary | ICD-10-CM | POA: Insufficient documentation

## 2019-07-26 HISTORY — PX: INGUINAL HERNIA REPAIR: SHX194

## 2019-07-26 SURGERY — REPAIR, HERNIA, INGUINAL, ADULT
Anesthesia: General | Laterality: Left

## 2019-07-26 MED ORDER — BUPIVACAINE-EPINEPHRINE (PF) 0.5% -1:200000 IJ SOLN
INTRAMUSCULAR | Status: DC | PRN
  Administered 2019-07-26: 20 mL

## 2019-07-26 MED ORDER — PHENYLEPHRINE HCL (PRESSORS) 10 MG/ML IV SOLN
INTRAVENOUS | Status: DC | PRN
  Administered 2019-07-26: 50 ug via INTRAVENOUS

## 2019-07-26 MED ORDER — ONDANSETRON HCL 4 MG/2ML IJ SOLN: 4.0000 mg | Freq: Once | INTRAMUSCULAR | Status: DC | PRN

## 2019-07-26 MED ORDER — FAMOTIDINE 20 MG PO TABS: 20.0000 mg | ORAL_TABLET | Freq: Once | ORAL | Status: AC

## 2019-07-26 MED ORDER — CEFAZOLIN SODIUM-DEXTROSE 2-4 GM/100ML-% IV SOLN
2.0000 g | INTRAVENOUS | Status: AC
  Administered 2019-07-26: 2 g via INTRAVENOUS

## 2019-07-26 MED ORDER — ONDANSETRON HCL 4 MG/2ML IJ SOLN
INTRAMUSCULAR | Status: DC | PRN
  Administered 2019-07-26: 4 mg via INTRAVENOUS

## 2019-07-26 MED ORDER — LACTATED RINGERS IV SOLN: INTRAVENOUS | Status: DC

## 2019-07-26 MED ORDER — FENTANYL CITRATE (PF) 100 MCG/2ML IJ SOLN: 25.0000 ug | INTRAMUSCULAR | Status: DC | PRN

## 2019-07-26 MED ORDER — FAMOTIDINE 20 MG PO TABS
ORAL_TABLET | ORAL | Status: AC
  Administered 2019-07-26: 20 mg via ORAL
  Filled 2019-07-26: qty 1

## 2019-07-26 MED ORDER — FENTANYL CITRATE (PF) 100 MCG/2ML IJ SOLN
INTRAMUSCULAR | Status: AC
  Filled 2019-07-26: qty 2

## 2019-07-26 MED ORDER — EPINEPHRINE PF 1 MG/ML IJ SOLN
INTRAMUSCULAR | Status: AC
  Filled 2019-07-26: qty 1

## 2019-07-26 MED ORDER — CEFAZOLIN SODIUM-DEXTROSE 2-4 GM/100ML-% IV SOLN
INTRAVENOUS | Status: AC
  Filled 2019-07-26: qty 100

## 2019-07-26 MED ORDER — PROPOFOL 10 MG/ML IV BOLUS
INTRAVENOUS | Status: AC
  Filled 2019-07-26: qty 20

## 2019-07-26 MED ORDER — FENTANYL CITRATE (PF) 100 MCG/2ML IJ SOLN
INTRAMUSCULAR | Status: DC | PRN
  Administered 2019-07-26: 50 ug via INTRAVENOUS

## 2019-07-26 MED ORDER — HYDROCODONE-ACETAMINOPHEN 5-325 MG PO TABS: 1.0000 | ORAL_TABLET | ORAL | 0 refills | Status: DC | PRN

## 2019-07-26 MED ORDER — LIDOCAINE HCL (CARDIAC) PF 100 MG/5ML IV SOSY
PREFILLED_SYRINGE | INTRAVENOUS | Status: DC | PRN
  Administered 2019-07-26: 60 mg via INTRAVENOUS

## 2019-07-26 MED ORDER — PROPOFOL 10 MG/ML IV BOLUS
INTRAVENOUS | Status: DC | PRN
  Administered 2019-07-26: 40 mg via INTRAVENOUS
  Administered 2019-07-26: 150 mg via INTRAVENOUS
  Administered 2019-07-26: 20 mg via INTRAVENOUS

## 2019-07-26 MED ORDER — ACETAMINOPHEN 10 MG/ML IV SOLN
INTRAVENOUS | Status: DC | PRN
  Administered 2019-07-26: 1000 mg via INTRAVENOUS

## 2019-07-26 MED ORDER — SEVOFLURANE IN SOLN
RESPIRATORY_TRACT | Status: AC
  Filled 2019-07-26: qty 250

## 2019-07-26 MED ORDER — EPHEDRINE SULFATE 50 MG/ML IJ SOLN
INTRAMUSCULAR | Status: DC | PRN
  Administered 2019-07-26: 10 mg via INTRAVENOUS

## 2019-07-26 MED ORDER — BUPIVACAINE HCL (PF) 0.5 % IJ SOLN
INTRAMUSCULAR | Status: AC
  Filled 2019-07-26: qty 30

## 2019-07-26 MED ORDER — ACETAMINOPHEN 10 MG/ML IV SOLN
INTRAVENOUS | Status: AC
  Filled 2019-07-26: qty 100

## 2019-07-26 SURGICAL SUPPLY — 35 items
APL PRP STRL LF DISP 70% ISPRP (MISCELLANEOUS) ×1
BLADE SURG 15 STRL SS SAFETY (BLADE) ×4 IMPLANT
CANISTER SUCT 1200ML W/VALVE (MISCELLANEOUS) ×2 IMPLANT
CHLORAPREP W/TINT 26 (MISCELLANEOUS) ×2 IMPLANT
COVER WAND RF STERILE (DRAPES) ×2 IMPLANT
DECANTER SPIKE VIAL GLASS SM (MISCELLANEOUS) ×2 IMPLANT
DRAIN PENROSE 1/4X12 LTX STRL (WOUND CARE) ×2 IMPLANT
DRAPE LAPAROTOMY 100X77 ABD (DRAPES) ×2 IMPLANT
DRSG TEGADERM 4X4.75 (GAUZE/BANDAGES/DRESSINGS) ×2 IMPLANT
DRSG TELFA 4X3 1S NADH ST (GAUZE/BANDAGES/DRESSINGS) ×2 IMPLANT
ELECT REM PT RETURN 9FT ADLT (ELECTROSURGICAL) ×2
ELECTRODE REM PT RTRN 9FT ADLT (ELECTROSURGICAL) ×1 IMPLANT
GLOVE BIO SURGEON STRL SZ7.5 (GLOVE) ×2 IMPLANT
GLOVE INDICATOR 8.0 STRL GRN (GLOVE) ×2 IMPLANT
GOWN STRL REUS W/ TWL LRG LVL3 (GOWN DISPOSABLE) ×2 IMPLANT
GOWN STRL REUS W/TWL LRG LVL3 (GOWN DISPOSABLE) ×4
KIT TURNOVER KIT A (KITS) ×2 IMPLANT
LABEL OR SOLS (LABEL) ×2 IMPLANT
MESH HERNIA 6X12 ULTRAPRO MED (Mesh General) ×1 IMPLANT
MESH HERNIA ULTRAPRO MED (Mesh General) ×1 IMPLANT
NEEDLE HYPO 22GX1.5 SAFETY (NEEDLE) ×4 IMPLANT
PACK BASIN MINOR ARMC (MISCELLANEOUS) ×2 IMPLANT
STRIP CLOSURE SKIN 1/2X4 (GAUZE/BANDAGES/DRESSINGS) ×2 IMPLANT
SUT PDS AB 0 CT1 27 (SUTURE) ×2 IMPLANT
SUT SURGILON 0 BLK (SUTURE) ×4 IMPLANT
SUT VIC AB 2-0 SH 27 (SUTURE) ×2
SUT VIC AB 2-0 SH 27XBRD (SUTURE) ×1 IMPLANT
SUT VIC AB 3-0 54X BRD REEL (SUTURE) ×1 IMPLANT
SUT VIC AB 3-0 BRD 54 (SUTURE) ×2
SUT VIC AB 3-0 SH 27 (SUTURE) ×2
SUT VIC AB 3-0 SH 27X BRD (SUTURE) ×1 IMPLANT
SUT VIC AB 4-0 FS2 27 (SUTURE) ×2 IMPLANT
SWABSTK COMLB BENZOIN TINCTURE (MISCELLANEOUS) ×2 IMPLANT
SYR 10ML LL (SYRINGE) ×2 IMPLANT
SYR 3ML LL SCALE MARK (SYRINGE) ×2 IMPLANT

## 2019-07-26 NOTE — Anesthesia Procedure Notes (Signed)
Procedure Name: LMA Insertion Date/Time: 07/26/2019 2:55 PM Performed by: Allean Found, CRNA Pre-anesthesia Checklist: Patient identified, Patient being monitored, Timeout performed, Emergency Drugs available and Suction available Patient Re-evaluated:Patient Re-evaluated prior to induction Oxygen Delivery Method: Circle system utilized Preoxygenation: Pre-oxygenation with 100% oxygen Induction Type: IV induction Ventilation: Mask ventilation without difficulty LMA: LMA inserted LMA Size: 5.0 Tube type: Oral Number of attempts: 1 Placement Confirmation: positive ETCO2 and breath sounds checked- equal and bilateral Tube secured with: Tape Dental Injury: Teeth and Oropharynx as per pre-operative assessment

## 2019-07-26 NOTE — H&P (Signed)
Todd Hodges [id number] 24-Sep-1940     HPI:  79 year old male with symptomatic left inguinal hernia. For elective repair.   Medications Prior to Admission  Medication Sig Dispense Refill Last Dose  . fluorouracil (EFUDEX) 5 % cream Apply 1 application topically daily as needed (itching / rash).    Past Week at Unknown time  . Glucosamine Sulfate 750 MG TABS Take 1,500 mg by mouth daily.   Past Week at Unknown time  . polycarbophil (FIBERCON) 625 MG tablet Take 625 mg by mouth daily at 12 noon.   Past Week at Unknown time  . polyethylene glycol (MIRALAX / GLYCOLAX) 17 g packet Take 4.2 g by mouth daily. Pt states he is taking 1/2 teaspoon.   07/25/2019 at Unknown time  . triamcinolone cream (KENALOG) 0.1 % Apply 1 application topically 2 (two) times daily as needed. (Patient taking differently: Apply 1 application topically 2 (two) times daily as needed (skin irritation). ) 45 g 1 Past Week at Unknown time  . vitamin B-12 (CYANOCOBALAMIN) 1000 MCG tablet Take 1,000 mcg by mouth daily.     Past Week at Unknown time   No Known Allergies Past Medical History:  Diagnosis Date  . Anemia    b12 deficiency  . Arthritis   . Cancer Todd Hodges)    SKIN CANCER  . Cataract    had surgery on both eyes  . GERD (gastroesophageal reflux disease)    NO MEDS  . Hemorrhoids   . Hx of adenomatous colonic polyps   . Hypertension    NO MEDS  . IBS (irritable bowel syndrome)   . Irregular heart beats   . Osteoporosis    knees  . Vitamin B12 deficiency    has responded to oral therapy   Past Surgical History:  Procedure Laterality Date  . 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 CATARACT EXTRACTION  2010   Bilateral  . COLONOSCOPY  06/2016  . EYE SURGERY  07/08/2008   cataracts bilaterally  . INGUINAL HERNIA REPAIR Right 06/09/2017   Medium Ultra Pro mesh.;  Surgeon: Robert Bellow, MD;  Location: [facility] ORS;  Service: General;  Laterality: Right;  . POLYPECTOMY      Social History   Socioeconomic History  . Marital status: Divorced    Spouse name: Not on file  . Number of children: 1  . Years of education: Not on file  . Highest education level: Not on file  Occupational History  . Occupation: Retired    Comment: as Office manager  Tobacco Use  . Smoking status: Never Smoker  . Smokeless tobacco: Never Used  Substance and Sexual Activity  . Alcohol use: No  . Drug use: No  . Sexual activity: Not on file  Other Topics Concern  . Not on file  Social History Narrative   Has living will   [facility] friend, [name], is health care POA   Would accept resuscitation attempts    No feeding tube if cognitively unaware   Social Determinants of Health   Financial Resource Strain:   . Difficulty of Paying Living Expenses: Not on file  Food Insecurity:   . Worried About Charity fundraiser in the Last Year: Not on file  . Ran Out of Food in the Last Year: Not on file  Transportation Needs:   . Lack of Transportation (Medical): Not on file  . Lack of Transportation (Non-Medical): Not on file  Physical  Activity:   . Days of Exercise per Week: Not on file  . Minutes of Exercise per Session: Not on file  Stress:   . Feeling of Stress : Not on file  Social Connections:   . Frequency of Communication with Friends and Family: Not on file  . Frequency of Social Gatherings with Friends and Family: Not on file  . Attends Religious Services: Not on file  . Active Member of Clubs or Organizations: Not on file  . Attends Archivist Meetings: Not on file  . Marital Status: Not on file  Intimate Partner Violence:   . Fear of Current or Ex-Partner: Not on file  . Emotionally Abused: Not on file  . Physically Abused: Not on file  . Sexually Abused: Not on file   Social History   Social History Narrative   Has living will   [name] friend, [name], is health care POA   Would accept resuscitation attempts    No feeding tube  if cognitively unaware     ROS: Negative.     PE: HEENT: Negative. Lungs: Clear. Cardio: RR.   Assessment/Plan:  Proceed with planned left inguinal hernia repair.  Todd Hodges [facility] 07/26/2019

## 2019-07-26 NOTE — Anesthesia Preprocedure Evaluation (Signed)
Anesthesia Evaluation  Patient identified by MRN, date of birth, ID band Patient awake    Reviewed: Allergy & Precautions, H&P , NPO status , Patient's Chart, lab work & pertinent test results, reviewed documented beta blocker date and time   Airway Mallampati: II  TM Distance: >3 FB Neck ROM: full    Dental  (+) Teeth Intact   Pulmonary neg pulmonary ROS,    Pulmonary exam normal        Cardiovascular Exercise Tolerance: Poor hypertension, On Medications negative cardio ROS Normal cardiovascular exam Rate:Normal     Neuro/Psych negative neurological ROS  negative psych ROS   GI/Hepatic negative GI ROS, Neg liver ROS, GERD  Medicated,  Endo/Other  negative endocrine ROS  Renal/GU negative Renal ROS  negative genitourinary   Musculoskeletal   Abdominal   Peds  Hematology negative hematology ROS (+) Blood dyscrasia, anemia ,   Anesthesia Other Findings   Reproductive/Obstetrics negative OB ROS                             Anesthesia Physical Anesthesia Plan  ASA: III  Anesthesia Plan: General LMA   Post-op Pain Management:    Induction:   PONV Risk Score and Plan: 3  Airway Management Planned:   Additional Equipment:   Intra-op Plan:   Post-operative Plan:   Informed Consent: I have reviewed the patients History and Physical, chart, labs and discussed the procedure including the risks, benefits and alternatives for the proposed anesthesia with the patient or authorized representative who has indicated his/her understanding and acceptance.       Plan Discussed with: CRNA  Anesthesia Plan Comments:         Anesthesia Quick Evaluation

## 2019-07-26 NOTE — Transfer of Care (Signed)
Immediate Anesthesia Transfer of Care Note  Patient: Todd Hodges  Procedure(s) Performed: HERNIA REPAIR INGUINAL ADULT (Left )  Patient Location: PACU  Anesthesia Type:General  Level of Consciousness: awake, alert  and oriented  Airway & Oxygen Therapy: Patient Spontanous Breathing and Patient connected to face mask oxygen  Post-op Assessment: Report given to RN and Post -op Vital signs reviewed and stable  Post vital signs: Reviewed and stable  Last Vitals:  Vitals Value Taken Time  BP 128/71 07/26/19 1551  Temp 37 C 07/26/19 1551  Pulse 71 07/26/19 1558  Resp 16 07/26/19 1558  SpO2 99 % 07/26/19 1558  Vitals shown include unvalidated device data.  Last Pain:  Vitals:   07/26/19 1551  TempSrc:   PainSc: 0-No pain         Complications: No apparent anesthesia complications

## 2019-07-26 NOTE — Discharge Instructions (Signed)

## 2019-07-26 NOTE — Op Note (Signed)
Preoperative diagnosis: Symptomatic left inguinal hernia.  Postoperative diagnosis: Same.  Operative procedure: Left inguinal hernia repair with a medium Ultra Pro mesh.  Operating surgeon: Hervey Ard, MD.  Anesthesia: General by LMA; Marcaine 0.5% with 1: 200,000 units of epinephrine; Toradol: 30 mg.  Estimated blood loss: Less than 5 cc.  Clinical note: This 79 year old male has developed symptomatic left inguinal hernia who was admitted for elective repair.  Hair was removed from the surgical site with clippers prior to presenting to the operating theater.  He received Ancef intravenously prior to the procedure.  SCD stockings for DVT prevention.  Operative note: With the patient under adequate general anesthesia the left lower abdomen and groin was cleansed with ChloraPrep and draped.  Field block anesthesia was established.  A 5 cm skin line incision along the anticipated course the inguinal canal was carried down through skin subtendinous tissue with hemostasis achieved by electrocautery.  The external oblique was opened in direction of its fibers.  The cord structures were mobilized and a moderate sized indirect sac was dissected free into the preperitoneal space.  A medium ultra Pro mesh was smoothed into the preperitoneal space and the external component along the floor the inguinal canal.  This was anchored to the pubic tubercle and along the inguinal ligament with interrupted 0 Surgilon sutures.  The medial and superior borders were anchored to the transverse abdominis aponeurosis in a similar fashion.  The ilioinguinal nerve was identified during the procedure but the iliohypogastric nerve was not seen.  Toradol was placed in the wound.  The external oblique closed with a running 2-0 Vicryl.  Scarpa's fascia was closed with a 3-0 Vicryl suture.  Skin was closed with a running 4-0 Vicryl subcuticular suture.  Benzoin, Steri-Strips, Telfa and Tegaderm dressing was applied.  The patient  tolerated the procedure well and was taken recovery room in stable condition.

## 2019-07-28 NOTE — Anesthesia Postprocedure Evaluation (Signed)
Anesthesia Post Note  Patient: Todd Hodges  Procedure(s) Performed: HERNIA REPAIR INGUINAL ADULT (Left )  Patient location during evaluation: PACU Anesthesia Type: General Level of consciousness: awake and alert Pain management: pain level controlled Vital Signs Assessment: post-procedure vital signs reviewed and stable Respiratory status: spontaneous breathing, nonlabored ventilation, respiratory function stable and patient connected to nasal cannula oxygen Cardiovascular status: blood pressure returned to baseline and stable Postop Assessment: no apparent nausea or vomiting Anesthetic complications: no     Last Vitals:  Vitals:   07/26/19 1641 07/26/19 1659  BP: 124/64 (!) 145/78  Pulse: 60 (!) 54  Resp: 15 18  Temp: (!) 36.3 C 36.7 C  SpO2: 99% 98%    Last Pain:  Vitals:   07/26/19 1659  TempSrc: Oral  PainSc: 0-No pain                 Martha Clan

## 2019-08-04 ENCOUNTER — Ambulatory Visit: Payer: Medicare HMO

## 2019-08-13 ENCOUNTER — Ambulatory Visit: Payer: Medicare HMO

## 2019-08-25 ENCOUNTER — Ambulatory Visit: Payer: Medicare HMO

## 2019-08-26 ENCOUNTER — Ambulatory Visit: Payer: Medicare HMO

## 2019-09-07 DIAGNOSIS — R69 Illness, unspecified: Secondary | ICD-10-CM | POA: Diagnosis not present

## 2019-11-22 MED ORDER — LINACLOTIDE 145 MCG PO CAPS: 145.0000 ug | ORAL_CAPSULE | Freq: Every day | ORAL | 3 refills | Status: DC

## 2020-02-15 DIAGNOSIS — Z85828 Personal history of other malignant neoplasm of skin: Secondary | ICD-10-CM | POA: Diagnosis not present

## 2020-02-15 DIAGNOSIS — X32XXXA Exposure to sunlight, initial encounter: Secondary | ICD-10-CM | POA: Diagnosis not present

## 2020-02-15 DIAGNOSIS — L570 Actinic keratosis: Secondary | ICD-10-CM | POA: Diagnosis not present

## 2020-02-15 DIAGNOSIS — D2262 Melanocytic nevi of left upper limb, including shoulder: Secondary | ICD-10-CM | POA: Diagnosis not present

## 2020-02-15 DIAGNOSIS — D2272 Melanocytic nevi of left lower limb, including hip: Secondary | ICD-10-CM | POA: Diagnosis not present

## 2020-02-15 DIAGNOSIS — D225 Melanocytic nevi of trunk: Secondary | ICD-10-CM | POA: Diagnosis not present

## 2020-02-15 DIAGNOSIS — D2261 Melanocytic nevi of right upper limb, including shoulder: Secondary | ICD-10-CM | POA: Diagnosis not present

## 2020-02-15 DIAGNOSIS — D2271 Melanocytic nevi of right lower limb, including hip: Secondary | ICD-10-CM | POA: Diagnosis not present

## 2020-03-15 DIAGNOSIS — R69 Illness, unspecified: Secondary | ICD-10-CM | POA: Diagnosis not present

## 2020-03-22 ENCOUNTER — Encounter: Payer: Self-pay | Admitting: Internal Medicine

## 2020-03-22 ENCOUNTER — Ambulatory Visit (INDEPENDENT_AMBULATORY_CARE_PROVIDER_SITE_OTHER): Payer: Medicare HMO | Admitting: Internal Medicine

## 2020-03-22 ENCOUNTER — Other Ambulatory Visit: Payer: Self-pay

## 2020-03-22 VITALS — BP 130/82 | HR 46 | Temp 97.5°F | Ht 64.5 in | Wt 179.0 lb

## 2020-03-22 DIAGNOSIS — N138 Other obstructive and reflux uropathy: Secondary | ICD-10-CM

## 2020-03-22 DIAGNOSIS — M170 Bilateral primary osteoarthritis of knee: Secondary | ICD-10-CM | POA: Diagnosis not present

## 2020-03-22 DIAGNOSIS — Z7189 Other specified counseling: Secondary | ICD-10-CM

## 2020-03-22 DIAGNOSIS — I10 Essential (primary) hypertension: Secondary | ICD-10-CM

## 2020-03-22 DIAGNOSIS — Z0000 Encounter for general adult medical examination without abnormal findings: Secondary | ICD-10-CM

## 2020-03-22 DIAGNOSIS — E538 Deficiency of other specified B group vitamins: Secondary | ICD-10-CM | POA: Diagnosis not present

## 2020-03-22 DIAGNOSIS — K580 Irritable bowel syndrome with diarrhea: Secondary | ICD-10-CM | POA: Diagnosis not present

## 2020-03-22 DIAGNOSIS — N401 Enlarged prostate with lower urinary tract symptoms: Secondary | ICD-10-CM

## 2020-03-22 DIAGNOSIS — Z23 Encounter for immunization: Secondary | ICD-10-CM | POA: Diagnosis not present

## 2020-03-22 LAB — CBC
HCT: 38.4 % — ABNORMAL LOW (ref 39.0–52.0)
Hemoglobin: 12.9 g/dL — ABNORMAL LOW (ref 13.0–17.0)
MCHC: 33.7 g/dL (ref 30.0–36.0)
MCV: 99.3 fl (ref 78.0–100.0)
Platelets: 217 10*3/uL (ref 150.0–400.0)
RBC: 3.87 Mil/uL — ABNORMAL LOW (ref 4.22–5.81)
RDW: 14.6 % (ref 11.5–15.5)
WBC: 6 10*3/uL (ref 4.0–10.5)

## 2020-03-22 LAB — COMPREHENSIVE METABOLIC PANEL
ALT: 9 U/L (ref 0–53)
AST: 15 U/L (ref 0–37)
Albumin: 4 g/dL (ref 3.5–5.2)
Alkaline Phosphatase: 76 U/L (ref 39–117)
BUN: 15 mg/dL (ref 6–23)
CO2: 27 mEq/L (ref 19–32)
Calcium: 8.8 mg/dL (ref 8.4–10.5)
Chloride: 103 mEq/L (ref 96–112)
Creatinine, Ser: 1.21 mg/dL (ref 0.40–1.50)
GFR: 57.66 mL/min — ABNORMAL LOW (ref >60.00)
Glucose, Bld: 85 mg/dL (ref 70–99)
Potassium: 4.3 mEq/L (ref 3.5–5.1)
Sodium: 138 mEq/L (ref 135–145)
Total Bilirubin: 0.7 mg/dL (ref 0.2–1.2)
Total Protein: 6.5 g/dL (ref 6.0–8.3)

## 2020-03-22 LAB — VITAMIN B12: Vitamin B-12: 608 pg/mL (ref 211–911)

## 2020-03-22 LAB — T4, FREE: Free T4: 1.08 ng/dL (ref 0.60–1.60)

## 2020-03-22 NOTE — Assessment & Plan Note (Signed)
Using OTC glucosamine Discussed quad strengthening

## 2020-03-22 NOTE — Assessment & Plan Note (Signed)
Okay on OTC supplements

## 2020-03-22 NOTE — Addendum Note (Signed)
Addended by: Pilar Grammes on: 03/22/2020 12:29 PM   Modules accepted: Orders

## 2020-03-22 NOTE — Progress Notes (Signed)
Subjective:    Patient ID: Todd Hodges, male    DOB: 07/15/1940, 79 y.o.   MRN: [id number]  HPI Here for Medicare wellness visit and follow up of chronic health conditions This visit occurred during the SARS-CoV-2 public health emergency.  Safety protocols were in place, including screening questions prior to the visit, additional usage of staff PPE, and extensive cleaning of exam room while observing appropriate contact time as indicated for disinfecting solutions.   Reviewed form and advanced directives Reviewed other doctors No alcohol or tobacco Continues to exercise regularly No falls No depression or anhedonia Vision is okay----but still has starburst phenomenon since cataracts. Follow up laser Rx didn't help. Now mostly on right Hearing is okay Independent with instrumental ADLs No sig memory issues  Did start the linzess around May It clearly worked---"it is explosive" Too much urgency and limited him going out Experimented with it and miralax Now taking it once a week---well before eating (and will have frequency and urgency for several hours). This is working for him  Continues on the B12 therapy No abnormal sensations Does notice some easy fatigue---like remodeling in house  Hernia repair went well No problems post op  Urinary flow okay in day Nocturia x 2 at least Wasn't happy with tamsulosin  No chest pain or SOB Mild decrease in exercise tolerance--no striking change No dizziness or syncope No edema No palpitations  Current Outpatient Medications on File Prior to Visit  Medication Sig Dispense Refill  . fluorouracil (EFUDEX) 5 % cream Apply 1 application topically daily as needed (itching / rash).     . Glucosamine Sulfate 750 MG TABS Take 1,500 mg by mouth daily.    Marland Kitchen linaclotide (LINZESS) 145 MCG CAPS capsule Take 1 capsule (145 mcg total) by mouth daily before breakfast. (Patient taking differently: Take 145 mcg by mouth daily before  breakfast. Pt says he takes it once a week.) 30 capsule 3  . polyethylene glycol (MIRALAX / GLYCOLAX) 17 g packet Take 4.2 g by mouth daily. Pt states he is taking 1/2 teaspoon.    . triamcinolone cream (KENALOG) 0.1 % Apply 1 application topically 2 (two) times daily as needed. (Patient taking differently: Apply 1 application topically 2 (two) times daily as needed (skin irritation). ) 45 g 1  . vitamin B-12 (CYANOCOBALAMIN) 1000 MCG tablet Take 1,000 mcg by mouth daily.       No current facility-administered medications on file prior to visit.    No Known Allergies  Past Medical History:  Diagnosis Date  . Anemia    b12 deficiency  . Arthritis   . Cancer [facility])    SKIN CANCER  . Cataract    had surgery on both eyes  . GERD (gastroesophageal reflux disease)    NO MEDS  . Hemorrhoids   . Hx of adenomatous colonic polyps   . Hypertension    NO MEDS  . IBS (irritable bowel syndrome)   . Irregular heart beats   . Osteoporosis    knees  . Vitamin B12 deficiency    has responded to oral therapy    Past Surgical History:  Procedure Laterality Date  . 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 CATARACT EXTRACTION  2010   Bilateral  . COLONOSCOPY  06/2016  . EYE SURGERY  07/08/2008   cataracts bilaterally  . INGUINAL HERNIA REPAIR Right 06/09/2017   Medium Ultra Pro mesh.;  Surgeon: Robert Bellow, MD;  Location:  [facility] ORS;  Service: General;  Laterality: Right;  . INGUINAL HERNIA REPAIR Left 07/26/2019   Procedure: HERNIA REPAIR INGUINAL ADULT;  Surgeon: Robert Bellow, MD;  Location: [facility] ORS;  Service: General;  Laterality: Left;  . POLYPECTOMY      Family History  Problem Relation Age of Onset  . Hypertension Father   . Cancer Sister        neck  . Heart disease Neg Hx   . Diabetes Neg Hx   . Prostate cancer Neg Hx   . Colon cancer Neg Hx     Social History   Socioeconomic History  . Marital status: Divorced    Spouse name: Not on file   . Number of children: 1  . Years of education: Not on file  . Highest education level: Not on file  Occupational History  . Occupation: Retired    Comment: as Office manager  Tobacco Use  . Smoking status: Never Smoker  . Smokeless tobacco: Never Used  Vaping Use  . Vaping Use: Never used  Substance and Sexual Activity  . Alcohol use: No  . Drug use: No  . Sexual activity: Not on file  Other Topics Concern  . Not on file  Social History Narrative   Has living will   [facility] friend, [name], is health care POA   Would accept resuscitation attempts    No feeding tube if cognitively unaware   Social Determinants of Health   Financial Resource Strain:   . Difficulty of Paying Living Expenses: Not on file  Food Insecurity:   . Worried About Charity fundraiser in the Last Year: Not on file  . Ran Out of Food in the Last Year: Not on file  Transportation Needs:   . Lack of Transportation (Medical): Not on file  . Lack of Transportation (Non-Medical): Not on file  Physical Activity:   . Days of Exercise per Week: Not on file  . Minutes of Exercise per Session: Not on file  Stress:   . Feeling of Stress : Not on file  Social Connections:   . Frequency of Communication with Friends and Family: Not on file  . Frequency of Social Gatherings with Friends and Family: Not on file  . Attends Religious Services: Not on file  . Active Member of Clubs or Organizations: Not on file  . Attends Archivist Meetings: Not on file  . Marital Status: Not on file  Intimate Partner Violence:   . Fear of Current or Ex-Partner: Not on file  . Emotionally Abused: Not on file  . Physically Abused: Not on file  . Sexually Abused: Not on file   Review of Systems  Appetite is good Weight stable Sleeps fairly well Wears seat belt Needs crown next week No skin lesions of concern today Worsened arthritis in knees and ankles---continues on glucosamine No heartburn or  dysphagia    Objective:   Physical Exam Constitutional:      Appearance: Normal appearance.  HENT:     Mouth/Throat:     Comments: No foot lesions Eyes:     Conjunctiva/sclera: Conjunctivae normal.     Pupils: Pupils are equal, round, and reactive to light.  Cardiovascular:     Rate and Rhythm: Normal rate and regular rhythm.     Pulses: Normal pulses.     Heart sounds: No murmur heard.  No gallop.      Comments: Rate ~60 Rare extra beat Pulmonary:  Effort: Pulmonary effort is normal.     Breath sounds: Normal breath sounds. No wheezing or rales.  Abdominal:     Palpations: Abdomen is soft.     Tenderness: There is no abdominal tenderness.  Musculoskeletal:     Cervical back: Neck supple.     Right lower leg: No edema.     Left lower leg: No edema.  Lymphadenopathy:     Cervical: No cervical adenopathy.  Skin:    General: Skin is warm.     Findings: No rash.  Neurological:     Mental Status: He is alert and oriented to person, place, and time.     Comments: President--- "Zoila Shutter, OBama" [id number] D-l-r-o-w Recall 3/3  Psychiatric:        Mood and Affect: Mood normal.        Behavior: Behavior normal.            Assessment & Plan:

## 2020-03-22 NOTE — Assessment & Plan Note (Signed)
See social history 

## 2020-03-22 NOTE — Assessment & Plan Note (Signed)
Annoying symptoms but not ready to try other medications Would try finasteride if symptoms worsen

## 2020-03-22 NOTE — Assessment & Plan Note (Signed)
Doing well with weekly linzess 145 Some miralax

## 2020-03-22 NOTE — Assessment & Plan Note (Signed)
I have personally reviewed the Medicare Annual Wellness questionnaire and have noted 1. The patient's medical and social history 2. Their use of alcohol, tobacco or illicit drugs 3. Their current medications and supplements 4. The patient's functional ability including ADL's, fall risks, home safety risks and hearing or visual             impairment. 5. Diet and physical activities 6. Evidence for depression or mood disorders  The patients weight, height, BMI and visual acuity have been recorded in the chart I have made referrals, counseling and provided education to the patient based review of the above and I have provided the pt with a written personalized care plan for preventive services.  I have provided you with a copy of your personalized plan for preventive services. Please take the time to review along with your updated medication list.  Done with cancer screening Does regular exercise Flu vaccine today Td if any injury COVID booster when available

## 2020-03-22 NOTE — Assessment & Plan Note (Signed)
BP Readings from Last 3 Encounters:  03/22/20 130/82  07/26/19 (!) 145/78  06/15/19 140/84   Still doing well without medications

## 2020-03-30 DIAGNOSIS — R69 Illness, unspecified: Secondary | ICD-10-CM | POA: Diagnosis not present

## 2020-04-11 ENCOUNTER — Telehealth: Payer: Self-pay | Admitting: *Deleted

## 2020-04-11 NOTE — Telephone Encounter (Signed)
Patient called stating that he has been exposed to covid. Patient stated that his friend came over Friday and spent time with him in his home. Patient stated that his friend and he both have had the covid vaccines back the beginning of the year. Patient stated that his friend was admitted to the hospital yesterday with covid. Patient stated that his friend was coughing when he was with him Friday. Patient stated that he has already scheduled an appointment tomorrow at [facility] to get a covid test. Patient was advised that he should quarantine until he gets his test results back and he verbalized understanding. Patient was given ER precautions and he verbalized understanding.

## 2020-04-12 ENCOUNTER — Other Ambulatory Visit: Payer: Medicare HMO

## 2020-04-12 DIAGNOSIS — Z20822 Contact with and (suspected) exposure to covid-19: Secondary | ICD-10-CM

## 2020-04-12 NOTE — Telephone Encounter (Signed)
Noted  

## 2020-04-13 LAB — SARS-COV-2, NAA 2 DAY TAT

## 2020-04-13 LAB — NOVEL CORONAVIRUS, NAA: SARS-CoV-2, NAA: NOT DETECTED

## 2020-04-17 ENCOUNTER — Ambulatory Visit: Payer: Medicare HMO | Attending: Internal Medicine

## 2020-04-17 DIAGNOSIS — Z23 Encounter for immunization: Secondary | ICD-10-CM

## 2020-04-17 NOTE — Progress Notes (Signed)
   Covid-19 Vaccination Clinic  Name:  Corry Ihnen    MRN: [id number] DOB: Dec 19, 1940  04/17/2020  Mr. Minotti was observed post Covid-19 immunization for 15 minutes without incident. He was provided with Vaccine Information Sheet and instruction to access the V-Safe system.   Mr. Sanger was instructed to call 911 with any severe reactions post vaccine: Marland Kitchen Difficulty breathing  . Swelling of face and throat  . A fast heartbeat  . A bad rash all over body  . Dizziness and weakness

## 2020-07-27 ENCOUNTER — Telehealth: Payer: Self-pay

## 2020-07-27 NOTE — Telephone Encounter (Signed)
Called pt and left 2 messages.

## 2020-07-27 NOTE — Telephone Encounter (Signed)
Pt cut left hand with sharp rusty piece of metal on 07/26/20 at 5PM; approx 1/2 ". Pt said bleed a lot when cut hand; pt said not bleeding today; pt has bandaid on hand. Pt said did not go in deep; last tetanus 09/09/2008. Due to metal being rusty does pt need to be seen or just keep clean and get updated tetanus shot. Dr Silvio Pate will be in office this afternoon; no available appts at [facility]; sending note to [facility] CMA and Dr Silvio Pate; pt will wait for cb.

## 2020-07-27 NOTE — Telephone Encounter (Signed)
Okay to add him at 4:15 today----or better, one of my later appts tomorrow morning.  Does need a visit and a tetanus booster

## 2020-07-28 ENCOUNTER — Ambulatory Visit (INDEPENDENT_AMBULATORY_CARE_PROVIDER_SITE_OTHER): Payer: Medicare HMO | Admitting: Internal Medicine

## 2020-07-28 ENCOUNTER — Other Ambulatory Visit: Payer: Self-pay

## 2020-07-28 ENCOUNTER — Encounter: Payer: Self-pay | Admitting: Internal Medicine

## 2020-07-28 VITALS — BP 122/78 | HR 55 | Temp 97.5°F | Ht 64.5 in | Wt 182.0 lb

## 2020-07-28 DIAGNOSIS — Z23 Encounter for immunization: Secondary | ICD-10-CM

## 2020-07-28 DIAGNOSIS — S61412A Laceration without foreign body of left hand, initial encounter: Secondary | ICD-10-CM | POA: Insufficient documentation

## 2020-07-28 NOTE — Assessment & Plan Note (Signed)
No infection Discussed proper care Will update Td

## 2020-07-28 NOTE — Progress Notes (Signed)
Subjective:    Patient ID: Todd Hodges, male    DOB: [birth year], 80 y.o.   MRN: [id number]  HPI Here due to cut on his hand This visit occurred during the SARS-CoV-2 public health emergency.  Safety protocols were in place, including screening questions prior to the visit, additional usage of staff PPE, and extensive cleaning of exam room while observing appropriate contact time as indicated for disinfecting solutions.   Cut left hand ---tying cover on boat trailer--not quite 2 days ago Nicked it on sharp place on boat trailer---rusty place [place] a lot---sheared skin Washed it and used pressure to stop the bleeding  Not really painful No redness  Current Outpatient Medications on File Prior to Visit  Medication Sig Dispense Refill   fluorouracil (EFUDEX) 5 % cream Apply 1 application topically daily as needed (itching / rash).      Glucosamine Sulfate 750 MG TABS Take 1,500 mg by mouth daily.     triamcinolone cream (KENALOG) 0.1 % Apply 1 application topically 2 (two) times daily as needed. (Patient taking differently: Apply 1 application topically 2 (two) times daily as needed (skin irritation).) 45 g 1   vitamin B-12 (CYANOCOBALAMIN) 1000 MCG tablet Take 1,000 mcg by mouth daily.     No current facility-administered medications on file prior to visit.    No Known Allergies  Past Medical History:  Diagnosis Date   Anemia    b12 deficiency   Arthritis    Cancer ([place])    SKIN CANCER   Cataract    had surgery on both eyes   GERD (gastroesophageal reflux disease)    NO MEDS   Hemorrhoids    Hx of adenomatous colonic polyps    Hypertension    NO MEDS   IBS (irritable bowel syndrome)    Irregular heart beats    Osteoporosis    knees   Vitamin B12 deficiency    has responded to oral therapy    Past Surgical History:  Procedure Laterality Date   BRANCHIAL CLEFT CYST EXCISION  07/08/1948   cyst left ear   CATARACT EXTRACTION  2010    Bilateral   COLONOSCOPY  06/2016   EYE SURGERY  07/08/2008   cataracts bilaterally   INGUINAL HERNIA REPAIR Right 06/09/2017   Medium Ultra Pro mesh.;  Surgeon: Robert Bellow, MD;  Location: [facility] ORS;  Service: General;  Laterality: Right;   INGUINAL HERNIA REPAIR Left 07/26/2019   Procedure: HERNIA REPAIR INGUINAL ADULT;  Surgeon: Robert Bellow, MD;  Location: [facility] ORS;  Service: General;  Laterality: Left;   POLYPECTOMY      Family History  Problem Relation Age of Onset   Hypertension Father    Cancer Sister        neck   Heart disease Neg Hx    Diabetes Neg Hx    Prostate cancer Neg Hx    Colon cancer Neg Hx     Social History   Socioeconomic History   Marital status: Divorced    Spouse name: Not on file   Number of children: 1   Years of education: Not on file   Highest education level: Not on file  Occupational History   Occupation: Retired    Comment: as Office manager  Tobacco Use   Smoking status: Never Smoker   Smokeless tobacco: Never Used  Scientific laboratory technician Use: Never used  Substance and Sexual Activity   Alcohol use: No   Drug use: No  Sexual activity: Not on file  Other Topics Concern   Not on file  Social History Narrative   Has living will   [facility] friend, [name], is health care POA   Would accept resuscitation attempts    No feeding tube if cognitively unaware   Social Determinants of Health   Financial Resource Strain: Not on file  Food Insecurity: Not on file  Transportation Needs: Not on file  Physical Activity: Not on file  Stress: Not on file  Social Connections: Not on file  Intimate Partner Violence: Not on file   Review of Systems  No fever Has tried the linzess---mostly would allow him to pass gas Now using gas-x with good results    Objective:   Physical Exam Skin:    Comments: Small elliptical laceration (with flap which he put back on) Over base of 5th left metacarpal No  inflammation or tenderness            Assessment & Plan:

## 2020-07-28 NOTE — Addendum Note (Signed)
Addended by: Pilar Grammes on: 07/28/2020 11:41 AM   Modules accepted: Orders

## 2020-07-28 NOTE — Telephone Encounter (Signed)
Pt coming in at 10

## 2020-09-27 DIAGNOSIS — D2271 Melanocytic nevi of right lower limb, including hip: Secondary | ICD-10-CM | POA: Diagnosis not present

## 2020-09-27 DIAGNOSIS — D2261 Melanocytic nevi of right upper limb, including shoulder: Secondary | ICD-10-CM | POA: Diagnosis not present

## 2020-09-27 DIAGNOSIS — Z85828 Personal history of other malignant neoplasm of skin: Secondary | ICD-10-CM | POA: Diagnosis not present

## 2020-09-27 DIAGNOSIS — L538 Other specified erythematous conditions: Secondary | ICD-10-CM | POA: Diagnosis not present

## 2020-09-27 DIAGNOSIS — L218 Other seborrheic dermatitis: Secondary | ICD-10-CM | POA: Diagnosis not present

## 2020-09-27 DIAGNOSIS — L570 Actinic keratosis: Secondary | ICD-10-CM | POA: Diagnosis not present

## 2020-09-27 DIAGNOSIS — D485 Neoplasm of uncertain behavior of skin: Secondary | ICD-10-CM | POA: Diagnosis not present

## 2020-09-27 DIAGNOSIS — L309 Dermatitis, unspecified: Secondary | ICD-10-CM | POA: Diagnosis not present

## 2020-09-27 DIAGNOSIS — D225 Melanocytic nevi of trunk: Secondary | ICD-10-CM | POA: Diagnosis not present

## 2020-09-27 DIAGNOSIS — X32XXXA Exposure to sunlight, initial encounter: Secondary | ICD-10-CM | POA: Diagnosis not present

## 2020-12-21 DIAGNOSIS — H43813 Vitreous degeneration, bilateral: Secondary | ICD-10-CM | POA: Diagnosis not present

## 2021-02-21 ENCOUNTER — Other Ambulatory Visit: Payer: Self-pay | Admitting: Internal Medicine

## 2021-02-24 ENCOUNTER — Other Ambulatory Visit: Payer: Self-pay | Admitting: Internal Medicine

## 2021-02-26 MED ORDER — TRIAMCINOLONE ACETONIDE 0.1 % EX CREA: TOPICAL_CREAM | CUTANEOUS | 0 refills | Status: DC

## 2021-02-26 NOTE — Addendum Note (Signed)
Addended by: Pilar Grammes on: 02/26/2021 10:31 AM   Modules accepted: Orders

## 2021-03-02 DIAGNOSIS — R030 Elevated blood-pressure reading, without diagnosis of hypertension: Secondary | ICD-10-CM | POA: Diagnosis not present

## 2021-03-02 DIAGNOSIS — I499 Cardiac arrhythmia, unspecified: Secondary | ICD-10-CM | POA: Diagnosis not present

## 2021-03-02 DIAGNOSIS — K5900 Constipation, unspecified: Secondary | ICD-10-CM | POA: Diagnosis not present

## 2021-03-02 DIAGNOSIS — R21 Rash and other nonspecific skin eruption: Secondary | ICD-10-CM | POA: Diagnosis not present

## 2021-03-02 DIAGNOSIS — Z809 Family history of malignant neoplasm, unspecified: Secondary | ICD-10-CM | POA: Diagnosis not present

## 2021-03-02 DIAGNOSIS — Z6830 Body mass index (BMI) 30.0-30.9, adult: Secondary | ICD-10-CM | POA: Diagnosis not present

## 2021-03-02 DIAGNOSIS — M1990 Unspecified osteoarthritis, unspecified site: Secondary | ICD-10-CM | POA: Diagnosis not present

## 2021-03-02 DIAGNOSIS — K219 Gastro-esophageal reflux disease without esophagitis: Secondary | ICD-10-CM | POA: Diagnosis not present

## 2021-03-02 DIAGNOSIS — E669 Obesity, unspecified: Secondary | ICD-10-CM | POA: Diagnosis not present

## 2021-03-02 DIAGNOSIS — Z7722 Contact with and (suspected) exposure to environmental tobacco smoke (acute) (chronic): Secondary | ICD-10-CM | POA: Diagnosis not present

## 2021-03-02 DIAGNOSIS — Z008 Encounter for other general examination: Secondary | ICD-10-CM | POA: Diagnosis not present

## 2021-03-23 ENCOUNTER — Encounter: Payer: Medicare HMO | Admitting: Internal Medicine

## 2021-03-28 ENCOUNTER — Encounter: Payer: Medicare HMO | Admitting: Internal Medicine

## 2021-04-04 DIAGNOSIS — L309 Dermatitis, unspecified: Secondary | ICD-10-CM | POA: Diagnosis not present

## 2021-04-04 DIAGNOSIS — L218 Other seborrheic dermatitis: Secondary | ICD-10-CM | POA: Diagnosis not present

## 2021-04-04 DIAGNOSIS — D2261 Melanocytic nevi of right upper limb, including shoulder: Secondary | ICD-10-CM | POA: Diagnosis not present

## 2021-04-04 DIAGNOSIS — X32XXXA Exposure to sunlight, initial encounter: Secondary | ICD-10-CM | POA: Diagnosis not present

## 2021-04-04 DIAGNOSIS — L570 Actinic keratosis: Secondary | ICD-10-CM | POA: Diagnosis not present

## 2021-04-04 DIAGNOSIS — D2271 Melanocytic nevi of right lower limb, including hip: Secondary | ICD-10-CM | POA: Diagnosis not present

## 2021-04-04 DIAGNOSIS — Z85828 Personal history of other malignant neoplasm of skin: Secondary | ICD-10-CM | POA: Diagnosis not present

## 2021-04-13 ENCOUNTER — Encounter: Payer: Self-pay | Admitting: Internal Medicine

## 2021-04-13 ENCOUNTER — Ambulatory Visit (INDEPENDENT_AMBULATORY_CARE_PROVIDER_SITE_OTHER): Payer: Medicare HMO | Admitting: Internal Medicine

## 2021-04-13 ENCOUNTER — Other Ambulatory Visit: Payer: Self-pay

## 2021-04-13 VITALS — BP 128/84 | HR 45 | Temp 97.5°F | Ht 64.0 in | Wt 176.0 lb

## 2021-04-13 DIAGNOSIS — Z23 Encounter for immunization: Secondary | ICD-10-CM

## 2021-04-13 DIAGNOSIS — K5909 Other constipation: Secondary | ICD-10-CM | POA: Diagnosis not present

## 2021-04-13 DIAGNOSIS — N401 Enlarged prostate with lower urinary tract symptoms: Secondary | ICD-10-CM | POA: Diagnosis not present

## 2021-04-13 DIAGNOSIS — M170 Bilateral primary osteoarthritis of knee: Secondary | ICD-10-CM

## 2021-04-13 DIAGNOSIS — Z0000 Encounter for general adult medical examination without abnormal findings: Secondary | ICD-10-CM | POA: Diagnosis not present

## 2021-04-13 DIAGNOSIS — I10 Essential (primary) hypertension: Secondary | ICD-10-CM

## 2021-04-13 DIAGNOSIS — N138 Other obstructive and reflux uropathy: Secondary | ICD-10-CM | POA: Diagnosis not present

## 2021-04-13 DIAGNOSIS — E538 Deficiency of other specified B group vitamins: Secondary | ICD-10-CM | POA: Diagnosis not present

## 2021-04-13 LAB — CBC
HCT: 38.1 % — ABNORMAL LOW (ref 39.0–52.0)
Hemoglobin: 12.8 g/dL — ABNORMAL LOW (ref 13.0–17.0)
MCHC: 33.4 g/dL (ref 30.0–36.0)
MCV: 98.9 fl (ref 78.0–100.0)
Platelets: 218 10*3/uL (ref 150.0–400.0)
RBC: 3.86 Mil/uL — ABNORMAL LOW (ref 4.22–5.81)
RDW: 13.9 % (ref 11.5–15.5)
WBC: 5.6 10*3/uL (ref 4.0–10.5)

## 2021-04-13 LAB — COMPREHENSIVE METABOLIC PANEL
ALT: 8 U/L (ref 0–53)
AST: 18 U/L (ref 0–37)
Albumin: 3.9 g/dL (ref 3.5–5.2)
Alkaline Phosphatase: 79 U/L (ref 39–117)
BUN: 17 mg/dL (ref 6–23)
CO2: 26 mEq/L (ref 19–32)
Calcium: 8.6 mg/dL (ref 8.4–10.5)
Chloride: 106 mEq/L (ref 96–112)
Creatinine, Ser: 1.12 mg/dL (ref 0.40–1.50)
GFR: 61.67 mL/min (ref >60.00)
Glucose, Bld: 91 mg/dL (ref 70–99)
Potassium: 3.7 mEq/L (ref 3.5–5.1)
Sodium: 140 mEq/L (ref 135–145)
Total Bilirubin: 1.1 mg/dL (ref 0.2–1.2)
Total Protein: 6.7 g/dL (ref 6.0–8.3)

## 2021-04-13 LAB — VITAMIN B12: Vitamin B-12: 659 pg/mL (ref 211–911)

## 2021-04-13 NOTE — Assessment & Plan Note (Signed)
Mild  No meds for now

## 2021-04-13 NOTE — Addendum Note (Signed)
Addended by: Pilar Grammes on: 04/13/2021 08:22 AM   Modules accepted: Orders

## 2021-04-13 NOTE — Assessment & Plan Note (Signed)
I have personally reviewed the Medicare Annual Wellness questionnaire and have noted 1. The patient's medical and social history 2. Their use of alcohol, tobacco or illicit drugs 3. Their current medications and supplements 4. The patient's functional ability including ADL's, fall risks, home safety risks and hearing or visual             impairment. 5. Diet and physical activities 6. Evidence for depression or mood disorders  The patients weight, height, BMI and visual acuity have been recorded in the chart I have made referrals, counseling and provided education to the patient based review of the above and I have provided the pt with a written personalized care plan for preventive services.  I have provided you with a copy of your personalized plan for preventive services. Please take the time to review along with your updated medication list.  Done with cancer screening Does exercise regularly Flu vaccine today Bivalent COVID at pharmacy Consider shingrix when covered

## 2021-04-13 NOTE — Assessment & Plan Note (Signed)
On daily supplement 

## 2021-04-13 NOTE — Progress Notes (Signed)
Subjective:    Patient ID: Todd Hodges, male    DOB: 1941/04/05, 80 y.o.   MRN: [id number]  HPI Here for Medicare wellness visit and follow up of chronic health conditions This visit occurred during the SARS-CoV-2 public health emergency.  Safety protocols were in place, including screening questions prior to the visit, additional usage of staff PPE, and extensive cleaning of exam room while observing appropriate contact time as indicated for disinfecting solutions.   Reviewed advanced directives Reviewed other doctors--Dr Porfilio--ophthal, Dr Lenis Noon, Dr Emily Filbert No hospitalizations or surgery Continues to exercise regularly No alcohol or tobaco Vision is okay Hearing is good---recent testing ([place]) No falls No depression or anhedonia Independent with instrumental ADLs No sig memory issues  Having stair lift put in his house--for basement stairs Steps are only access to house---sig other had leg problems and needed it then Is concerned about future need  Still having bowel problems Uses the linzess prn--once a week or less (it really empties him out) Using gaviscon with most meals (for gas, etc)  Having pain with overriding left 2nd toe---does use spacer  Had home visit with NP through insurance Noted murmur and extra beats  Recent derm visit---multiple spots with cryotherapy Wants me to check spot inside left pinna that was treated  No chest pain or SOB No dizziness or syncope No edema Will rarely feel his heart---skip---mostly when quiet at night  Continues on B12 daily No sensory changes  Current Outpatient Medications on File Prior to Visit  Medication Sig Dispense Refill   Alum Hydroxide-Mag Carbonate (GAVISCON PO) Take 1 tablet by mouth 3 (three) times daily after meals.     fluorouracil (EFUDEX) 5 % cream Apply 1 application topically daily as needed (itching / rash).      Glucosamine Sulfate 750 MG TABS Take 1,500 mg by mouth  daily.     linaclotide (LINZESS) 145 MCG CAPS capsule Take 145 mcg by mouth once a week.     triamcinolone cream (KENALOG) 0.1 % APPLY TOPICALLY TWO TIMES DAILY AS NEEDED 45 g 0   vitamin B-12 (CYANOCOBALAMIN) 1000 MCG tablet Take 1,000 mcg by mouth daily.     No current facility-administered medications on file prior to visit.    Not on File  Past Medical History:  Diagnosis Date   Anemia    b12 deficiency   Arthritis    Cancer ([place])    SKIN CANCER   Cataract    had surgery on both eyes   GERD (gastroesophageal reflux disease)    NO MEDS   Hemorrhoids    Hx of adenomatous colonic polyps    Hypertension    NO MEDS   IBS (irritable bowel syndrome)    Irregular heart beats    Osteoporosis    knees   Vitamin B12 deficiency    has responded to oral therapy    Past Surgical History:  Procedure Laterality Date   BRANCHIAL CLEFT CYST EXCISION  07/08/1948   cyst left ear   CATARACT EXTRACTION  2010   Bilateral   COLONOSCOPY  06/2016   EYE SURGERY  07/08/2008   cataracts bilaterally   INGUINAL HERNIA REPAIR Right 06/09/2017   Medium Ultra Pro mesh.;  Surgeon: Robert Bellow, MD;  Location: [facility] ORS;  Service: General;  Laterality: Right;   INGUINAL HERNIA REPAIR Left 07/26/2019   Procedure: HERNIA REPAIR INGUINAL ADULT;  Surgeon: Robert Bellow, MD;  Location: [facility] ORS;  Service: General;  Laterality: Left;  POLYPECTOMY      Family History  Problem Relation Age of Onset   Hypertension Father    Cancer Sister        neck   Heart disease Neg Hx    Diabetes Neg Hx    Prostate cancer Neg Hx    Colon cancer Neg Hx     Social History   Socioeconomic History   Marital status: Divorced    Spouse name: Not on file   Number of children: 1   Years of education: Not on file   Highest education level: Not on file  Occupational History   Occupation: Retired    Comment: as Office manager  Tobacco Use   Smoking status: Never   Smokeless tobacco: Never   Scientific laboratory technician Use: Never used  Substance and Sexual Activity   Alcohol use: No   Drug use: No   Sexual activity: Not on file  Other Topics Concern   Not on file  Social History Narrative   Has living will   [place] friend, [name], is health care POA   Would accept resuscitation attempts    No feeding tube if cognitively unaware   Social Determinants of Health   Financial Resource Strain: Not on file  Food Insecurity: Not on file  Transportation Needs: Not on file  Physical Activity: Not on file  Stress: Not on file  Social Connections: Not on file  Intimate Partner Violence: Not on file   Review of Systems Appetite is good Weight down a few pounds from last year Sleeps okay Wears seat belt Stream is okay for urine---nocturia x 1 is stable. Will have rare severe urgency when driving a distance No regular heartburn or dysphagia Ongoing knee arthritis---no regular meds Recent crown     Objective:   Physical Exam Constitutional:      Appearance: Normal appearance.  HENT:     Mouth/Throat:     Comments: No lesions Eyes:     Conjunctiva/sclera: Conjunctivae normal.     Pupils: Pupils are equal, round, and reactive to light.  Cardiovascular:     Rate and Rhythm: Regular rhythm.     Pulses: Normal pulses.     Heart sounds: No murmur heard.   No gallop.  Pulmonary:     Effort: Pulmonary effort is normal.     Breath sounds: Normal breath sounds. No wheezing or rales.  Abdominal:     Palpations: Abdomen is soft.     Tenderness: There is no abdominal tenderness.  Musculoskeletal:     Cervical back: Neck supple.     Right lower leg: No edema.     Left lower leg: No edema.  Lymphadenopathy:     Cervical: No cervical adenopathy.  Skin:    Findings: No rash.  Neurological:     Mental Status: He is alert and oriented to person, place, and time.     Comments: President---"Biden, Trump, Obama" [id number] D-l-r-o-w Recall 3/3  Psychiatric:         Mood and Affect: Mood normal.        Behavior: Behavior normal.           Assessment & Plan:

## 2021-04-13 NOTE — Assessment & Plan Note (Signed)
BP Readings from Last 3 Encounters:  04/13/21 128/84  07/28/20 122/78  03/22/20 130/82   Okay without meds

## 2021-04-13 NOTE — Assessment & Plan Note (Signed)
Still uses the linzess once a week or so Gaviscon for chronic gas, etc

## 2021-04-13 NOTE — Progress Notes (Signed)
Hearing Screening - Comments:: March 2022. Suggested hearing aids but pt declined. Vision Screening - Comments:: May 2022

## 2021-04-13 NOTE — Assessment & Plan Note (Signed)
Would use tylenol if worsens

## 2021-06-02 ENCOUNTER — Other Ambulatory Visit: Payer: Self-pay | Admitting: Internal Medicine

## 2021-06-05 NOTE — Telephone Encounter (Signed)
Linzess listed as historical medication under patient's active list. LOV 04/13/21 for CPE Next appointment on 04/16/22

## 2021-06-20 DIAGNOSIS — H43813 Vitreous degeneration, bilateral: Secondary | ICD-10-CM | POA: Diagnosis not present

## 2021-11-19 ENCOUNTER — Encounter: Payer: Self-pay | Admitting: Internal Medicine

## 2021-12-06 ENCOUNTER — Telehealth: Payer: Self-pay

## 2021-12-06 NOTE — Telephone Encounter (Signed)
[facility] Night - Client Nonclinical Telephone Record  [facility] Client [facility] Night - Client Client Site [place] Provider Viviana Simpler- MD Contact Type Call Who Is Calling Patient / Member / Family / Caregiver Caller Name Todd Hodges Caller Phone Number [phone] Patient Name Todd Hodges Patient DOB [birth year] Call Type Message Only Information Provided Reason for Call Request to Reschedule Office Appointment Initial Comment Caller states that he got a message yesterday that his October appointment has been cancelled and he needed to call to get this rescheduled. Patient request to speak to RN No Additional Comment Caller declined triage and states that he would like a call back right away to have this appointment changed that the office had to cancel. Disp. Time Disposition Final User 12/06/2021 8:01:53 AM General Information Provided Yes Wynema Birch Call Closed By: Wynema Birch Transaction Date/Time: 12/06/2021 7:59:08 AM (ET

## 2022-03-11 ENCOUNTER — Encounter: Payer: Self-pay | Admitting: Internal Medicine

## 2022-03-12 MED ORDER — LINACLOTIDE 290 MCG PO CAPS: 290.0000 ug | ORAL_CAPSULE | Freq: Every day | ORAL | 3 refills | Status: DC

## 2022-04-04 DIAGNOSIS — X32XXXA Exposure to sunlight, initial encounter: Secondary | ICD-10-CM | POA: Diagnosis not present

## 2022-04-04 DIAGNOSIS — L728 Other follicular cysts of the skin and subcutaneous tissue: Secondary | ICD-10-CM | POA: Diagnosis not present

## 2022-04-04 DIAGNOSIS — L570 Actinic keratosis: Secondary | ICD-10-CM | POA: Diagnosis not present

## 2022-04-04 DIAGNOSIS — D234 Other benign neoplasm of skin of scalp and neck: Secondary | ICD-10-CM | POA: Diagnosis not present

## 2022-04-04 DIAGNOSIS — R208 Other disturbances of skin sensation: Secondary | ICD-10-CM | POA: Diagnosis not present

## 2022-04-04 DIAGNOSIS — Z85828 Personal history of other malignant neoplasm of skin: Secondary | ICD-10-CM | POA: Diagnosis not present

## 2022-04-04 DIAGNOSIS — L309 Dermatitis, unspecified: Secondary | ICD-10-CM | POA: Diagnosis not present

## 2022-04-04 DIAGNOSIS — D044 Carcinoma in situ of skin of scalp and neck: Secondary | ICD-10-CM | POA: Diagnosis not present

## 2022-04-04 DIAGNOSIS — D485 Neoplasm of uncertain behavior of skin: Secondary | ICD-10-CM | POA: Diagnosis not present

## 2022-04-04 DIAGNOSIS — D2261 Melanocytic nevi of right upper limb, including shoulder: Secondary | ICD-10-CM | POA: Diagnosis not present

## 2022-04-04 DIAGNOSIS — D2271 Melanocytic nevi of right lower limb, including hip: Secondary | ICD-10-CM | POA: Diagnosis not present

## 2022-04-16 ENCOUNTER — Encounter: Payer: Medicare HMO | Admitting: Internal Medicine

## 2022-04-26 ENCOUNTER — Encounter: Payer: Self-pay | Admitting: Internal Medicine

## 2022-04-26 ENCOUNTER — Ambulatory Visit (INDEPENDENT_AMBULATORY_CARE_PROVIDER_SITE_OTHER): Payer: Medicare HMO | Admitting: Internal Medicine

## 2022-04-26 VITALS — BP 138/82 | HR 51 | Temp 97.8°F | Ht 64.5 in | Wt 179.0 lb

## 2022-04-26 DIAGNOSIS — E538 Deficiency of other specified B group vitamins: Secondary | ICD-10-CM | POA: Diagnosis not present

## 2022-04-26 DIAGNOSIS — I10 Essential (primary) hypertension: Secondary | ICD-10-CM | POA: Diagnosis not present

## 2022-04-26 DIAGNOSIS — M170 Bilateral primary osteoarthritis of knee: Secondary | ICD-10-CM | POA: Diagnosis not present

## 2022-04-26 DIAGNOSIS — Z0000 Encounter for general adult medical examination without abnormal findings: Secondary | ICD-10-CM | POA: Diagnosis not present

## 2022-04-26 DIAGNOSIS — K5909 Other constipation: Secondary | ICD-10-CM | POA: Diagnosis not present

## 2022-04-26 LAB — COMPREHENSIVE METABOLIC PANEL
ALT: 12 U/L (ref 0–53)
AST: 22 U/L (ref 0–37)
Albumin: 4.1 g/dL (ref 3.5–5.2)
Alkaline Phosphatase: 80 U/L (ref 39–117)
BUN: 17 mg/dL (ref 6–23)
CO2: 26 mEq/L (ref 19–32)
Calcium: 9 mg/dL (ref 8.4–10.5)
Chloride: 105 mEq/L (ref 96–112)
Creatinine, Ser: 1.09 mg/dL (ref 0.40–1.50)
GFR: 63.26 mL/min (ref >60.00)
Glucose, Bld: 86 mg/dL (ref 70–99)
Potassium: 4.1 mEq/L (ref 3.5–5.1)
Sodium: 139 mEq/L (ref 135–145)
Total Bilirubin: 0.7 mg/dL (ref 0.2–1.2)
Total Protein: 6.9 g/dL (ref 6.0–8.3)

## 2022-04-26 LAB — CBC
HCT: 39.6 % (ref 39.0–52.0)
Hemoglobin: 12.9 g/dL — ABNORMAL LOW (ref 13.0–17.0)
MCHC: 32.6 g/dL (ref 30.0–36.0)
MCV: 100.3 fl — ABNORMAL HIGH (ref 78.0–100.0)
Platelets: 243 10*3/uL (ref 150.0–400.0)
RBC: 3.95 Mil/uL — ABNORMAL LOW (ref 4.22–5.81)
RDW: 14.1 % (ref 11.5–15.5)
WBC: 6.2 10*3/uL (ref 4.0–10.5)

## 2022-04-26 LAB — VITAMIN B12: Vitamin B-12: 610 pg/mL (ref 211–911)

## 2022-04-26 NOTE — Assessment & Plan Note (Signed)
Will recheck levels

## 2022-04-26 NOTE — Assessment & Plan Note (Signed)
Uses 1/8th to 1/4 of the linzess daily This has worked well

## 2022-04-26 NOTE — Assessment & Plan Note (Signed)
BP Readings from Last 3 Encounters:  04/26/22 138/82  04/13/21 128/84  07/28/20 122/78   Still doing okay without meds

## 2022-04-26 NOTE — Progress Notes (Signed)
Subjective:    Patient ID: Todd Hodges, male    DOB: [birth year], 81 y.o.   MRN: [id number]  HPI Here for Medicare wellness visit and follow up of chronic health conditions Reviewed advanced directives Reviewed other doctors---Dr King--ophthal, Dr Lenis Noon, Dr Emily Filbert, Dr Percival Spanish No hospitalizations or surgery in the past year Vision is okay--but he notes occ aura (like a half moon) Hearing is fine No alcohol or tobacco Still exercises regularly No falls No depression or anhedonia Independent with instrumental ADLs Mild memory issues--mostly names/recall  Has chronic irritated place by sacrum Dr Evorn Gong changed trimacinolone to tacrolimus--called it psoriasis May be helping some Fluorouracil for actinic damage on scalp  Is cutting the linzess into 8 pieces Takes 1-2 daily That manages bowels---not constipated (but no urgency like on higher dose) No blood  Continues on vitamin B12  No chest pain or SOB No dizziness or syncope No palpitations --but thinks it skips at times No edema  Arthritis in knees is worse More pain going up and down steps Hasn't needed the stair lift as yet Has tried topical voltaren  Current Outpatient Medications on File Prior to Visit  Medication Sig Dispense Refill   Alum Hydroxide-Mag Carbonate (GAVISCON PO) Take 1 tablet by mouth 3 (three) times daily after meals.     Glucosamine Sulfate 750 MG TABS Take 1,500 mg by mouth daily.     linaclotide (LINZESS) 290 MCG CAPS capsule Take 1 capsule (290 mcg total) by mouth daily before breakfast. Or as directed 30 capsule 3   tacrolimus (PROTOPIC) 0.1 % ointment Apply topically 2 (two) times daily as needed.     vitamin B-12 (CYANOCOBALAMIN) 1000 MCG tablet Take 1,000 mcg by mouth daily.     No current facility-administered medications on file prior to visit.    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   cyst left ear   CATARACT EXTRACTION  2010   Bilateral   COLONOSCOPY  06/2016   EYE SURGERY  07/08/2008   cataracts bilaterally   INGUINAL HERNIA REPAIR Right 06/09/2017   Medium Ultra Pro mesh.;  Surgeon: Robert Bellow, MD;  Location: [facility];  Service: General;  Laterality: Right;   INGUINAL HERNIA REPAIR Left 07/26/2019   Procedure: HERNIA REPAIR INGUINAL ADULT;  Surgeon: Robert Bellow, MD;  Location: [facility];  Service: General;  Laterality: Left;   POLYPECTOMY      Family History  Problem Relation Age of Onset   Hypertension Father    Cancer Sister        neck   Heart disease Neg Hx    Diabetes Neg Hx    Prostate cancer Neg Hx    Colon cancer Neg Hx     Social History   Socioeconomic History   Marital status: Divorced  Spouse name: Not on file   Number of children: 1   Years of education: Not on file   Highest education level: Not on file  Occupational History   Occupation: Retired    Comment: as Office manager  Tobacco Use   Smoking status: Never    Passive exposure: Past   Smokeless tobacco: Never  Vaping Use   Vaping Use: Never used  Substance and Sexual Activity   Alcohol use: No   Drug use: No   Sexual activity: Not on file  Other Topics Concern   Not on file  Social History Narrative   Has living will   [place] friend, [name], is health care POA   Would accept resuscitation attempts    No feeding tube if cognitively unaware   Social Determinants of Health   Financial Resource  Strain: Not on file  Food Insecurity: Not on file  Transportation Needs: Not on file  Physical Activity: Not on file  Stress: Not on file  Social Connections: Not on file  Intimate Partner Violence: Not on file   Review of Systems Appetite is good Weight is slightly down over time Sleeps okay Wears seat belt Teeth okay---but did need root canal this year Occ heartburn--no meds. No dysphagia No suspicious skin lesions currently--just at derm    Objective:   Physical Exam Constitutional:      Appearance: Normal appearance.  HENT:     Mouth/Throat:     Comments: No lesions Eyes:     Conjunctiva/sclera: Conjunctivae normal.     Pupils: Pupils are equal, round, and reactive to light.  Cardiovascular:     Rate and Rhythm: Normal rate and regular rhythm.     Pulses: Normal pulses.     Heart sounds: No murmur heard.    No gallop.  Pulmonary:     Effort: Pulmonary effort is normal.     Breath sounds: Normal breath sounds. No wheezing or rales.  Abdominal:     Palpations: Abdomen is soft.     Tenderness: There is no abdominal tenderness.  Musculoskeletal:     Cervical back: Neck supple.     Right lower leg: No edema.     Left lower leg: No edema.  Lymphadenopathy:     Cervical: No cervical adenopathy.  Skin:    Findings: No lesion or rash.     Comments: Mycotic great toenails  Neurological:     General: No focal deficit present.     Mental Status: He is alert and oriented to person, place, and time.     Comments: Mini-cog normal  Psychiatric:        Mood and Affect: Mood normal.        Behavior: Behavior normal.            Assessment & Plan:

## 2022-04-26 NOTE — Assessment & Plan Note (Signed)
I have personally reviewed the Medicare Annual Wellness questionnaire and have noted 1. The patient's medical and social history 2. Their use of alcohol, tobacco or illicit drugs 3. Their current medications and supplements 4. The patient's functional ability including ADL's, fall risks, home safety risks and hearing or visual             impairment. 5. Diet and physical activities 6. Evidence for depression or mood disorders  The patients weight, height, BMI and visual acuity have been recorded in the chart I have made referrals, counseling and provided education to the patient based review of the above and I have provided the pt with a written personalized care plan for preventive services.  I have provided you with a copy of your personalized plan for preventive services. Please take the time to review along with your updated medication list.  Done with cancer screening Exercises regularly Flu vaccine today He prefers no more COVID vaccines Consider shingrix at pharmacy

## 2022-04-26 NOTE — Assessment & Plan Note (Signed)
Using topical diclofenac now

## 2022-04-29 DIAGNOSIS — D044 Carcinoma in situ of skin of scalp and neck: Secondary | ICD-10-CM | POA: Diagnosis not present

## 2022-06-14 DIAGNOSIS — Z791 Long term (current) use of non-steroidal anti-inflammatories (NSAID): Secondary | ICD-10-CM | POA: Diagnosis not present

## 2022-06-14 DIAGNOSIS — R030 Elevated blood-pressure reading, without diagnosis of hypertension: Secondary | ICD-10-CM | POA: Diagnosis not present

## 2022-06-14 DIAGNOSIS — L309 Dermatitis, unspecified: Secondary | ICD-10-CM | POA: Diagnosis not present

## 2022-06-14 DIAGNOSIS — Z6831 Body mass index (BMI) 31.0-31.9, adult: Secondary | ICD-10-CM | POA: Diagnosis not present

## 2022-06-14 DIAGNOSIS — Z008 Encounter for other general examination: Secondary | ICD-10-CM | POA: Diagnosis not present

## 2022-06-14 DIAGNOSIS — K581 Irritable bowel syndrome with constipation: Secondary | ICD-10-CM | POA: Diagnosis not present

## 2022-06-14 DIAGNOSIS — M1990 Unspecified osteoarthritis, unspecified site: Secondary | ICD-10-CM | POA: Diagnosis not present

## 2022-06-14 DIAGNOSIS — Z79621 Long term (current) use of calcineurin inhibitor: Secondary | ICD-10-CM | POA: Diagnosis not present

## 2022-06-14 DIAGNOSIS — E669 Obesity, unspecified: Secondary | ICD-10-CM | POA: Diagnosis not present

## 2022-06-21 DIAGNOSIS — H43813 Vitreous degeneration, bilateral: Secondary | ICD-10-CM | POA: Diagnosis not present

## 2022-08-20 ENCOUNTER — Ambulatory Visit (INDEPENDENT_AMBULATORY_CARE_PROVIDER_SITE_OTHER): Payer: Medicare HMO | Admitting: Internal Medicine

## 2022-08-20 ENCOUNTER — Encounter: Payer: Self-pay | Admitting: Internal Medicine

## 2022-08-20 VITALS — BP 138/78 | HR 49 | Temp 97.6°F | Ht 64.5 in | Wt 179.0 lb

## 2022-08-20 DIAGNOSIS — R1010 Upper abdominal pain, unspecified: Secondary | ICD-10-CM | POA: Diagnosis not present

## 2022-08-20 DIAGNOSIS — M542 Cervicalgia: Secondary | ICD-10-CM | POA: Diagnosis not present

## 2022-08-20 DIAGNOSIS — M170 Bilateral primary osteoarthritis of knee: Secondary | ICD-10-CM | POA: Diagnosis not present

## 2022-08-20 DIAGNOSIS — R109 Unspecified abdominal pain: Secondary | ICD-10-CM | POA: Insufficient documentation

## 2022-08-20 NOTE — Assessment & Plan Note (Signed)
3 episodes of post prandial pain--suggestive of gallbladder Could be acid related --but less likely Will check ultrasound and refer to surgeon [facility] in [place]) If negative, trial with PPI for a few weeks

## 2022-08-20 NOTE — Progress Notes (Signed)
Subjective:    Patient ID: Todd Hodges, male    DOB: 16-Jul-1940, 82 y.o.   MRN: [id number]  HPI Here due to neck pain and GI issues  Went to complementary dinner at [employer] [facility] where his lady friend is moving Got salmon salad and pie Got up and started walking around--then got bad pain in abdomen Took 30 minutes or more till the pain settled down 3rd time in several months this happened Felt a lot of gas--and couldn't really get it up  Happened after lunch at [facility] also  No heartburn in general No dysphagia No fever--but did get hot with this spell  Also having neck pain Trouble with pain if he tries turning head to either side Affects him checking his blind spots in car Goes back a month---has happened before but improved  Current Outpatient Medications on File Prior to Visit  Medication Sig Dispense Refill   Glucosamine Sulfate 750 MG TABS Take 1,500 mg by mouth daily.     linaclotide (LINZESS) 290 MCG CAPS capsule Take 1 capsule (290 mcg total) by mouth daily before breakfast. Or as directed 30 capsule 3   tacrolimus (PROTOPIC) 0.1 % ointment Apply topically 2 (two) times daily as needed.     vitamin B-12 (CYANOCOBALAMIN) 1000 MCG tablet Take 1,000 mcg by mouth daily.     No current facility-administered medications on file prior to visit.    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   cyst left ear   CATARACT EXTRACTION  2010   Bilateral    COLONOSCOPY  06/2016   EYE SURGERY  07/08/2008   cataracts bilaterally   INGUINAL HERNIA REPAIR Right 06/09/2017   Medium Ultra Pro mesh.;  Surgeon: Robert Bellow, MD;  Location: [facility];  Service: General;  Laterality: Right;   INGUINAL HERNIA REPAIR Left 07/26/2019   Procedure: HERNIA REPAIR INGUINAL ADULT;  Surgeon: Robert Bellow, MD;  Location: [facility];  Service: General;  Laterality: Left;   POLYPECTOMY      Family History  Problem Relation Age of Onset   Hypertension Father    Cancer Sister        neck   Heart disease Neg Hx    Diabetes Neg Hx    Prostate cancer Neg Hx    Colon cancer Neg Hx     Social History   Socioeconomic History   Marital status: Divorced    Spouse name: Not on file   Number of children: 1   Years of education: Not on file   Highest education level: Not on file  Occupational History   Occupation: Retired    Comment: as Office manager  Tobacco Use   Smoking status: Never    Passive exposure: Past   Smokeless tobacco: Never  Media planner  Vaping Use: Never used  Substance and Sexual Activity   Alcohol use: No   Drug use: No   Sexual activity: Not on file  Other Topics Concern   Not on file  Social History Narrative   Has living will   [facility] friend, [name], is health care POA   Would accept resuscitation attempts    No feeding tube if cognitively unaware   Social Determinants of Health   Financial Resource Strain: Not on file  Food Insecurity: Not on file  Transportation Needs: Not on file  Physical Activity: Not on file  Stress: Not on file  Social Connections: Not on file  Intimate Partner Violence: Not on file   Review of Systems Ongoing knee arthritis--also ankles Pain on steps     Objective:   Physical Exam Constitutional:      Appearance: Normal appearance.  Neck:     Comments: Flexion/extension fairly normal Limited rotation and tilt both sides Abdominal:     General: Bowel sounds are  normal.     Palpations: Abdomen is soft.     Tenderness: There is no abdominal tenderness. There is no guarding or rebound.  Neurological:     Mental Status: He is alert.     Comments: No arm or hand weakness            Assessment & Plan:

## 2022-08-20 NOTE — Assessment & Plan Note (Signed)
Worsening [facility] ([facility] and arm weights) ----discussed adding leg strengthening Start regular tylenol arthritis 650 morning and afternoon Didn't think  topical diclofenac helped in the past --might want to try again

## 2022-08-20 NOTE — Assessment & Plan Note (Signed)
May be arthritic but no evidence of disc/radiculopathy Discussed heat wrap Can try tylenol also

## 2022-08-21 ENCOUNTER — Ambulatory Visit
Admission: RE | Admit: 2022-08-21 | Discharge: 2022-08-21 | Disposition: A | Discharge status: Home / Self Care | Payer: Medicare HMO | Source: Ambulatory Visit | Attending: Internal Medicine | Admitting: Internal Medicine

## 2022-08-21 DIAGNOSIS — R1010 Upper abdominal pain, unspecified: Secondary | ICD-10-CM | POA: Diagnosis not present

## 2022-08-21 DIAGNOSIS — R109 Unspecified abdominal pain: Secondary | ICD-10-CM | POA: Diagnosis not present

## 2022-10-24 DIAGNOSIS — Z85828 Personal history of other malignant neoplasm of skin: Secondary | ICD-10-CM | POA: Diagnosis not present

## 2022-10-24 DIAGNOSIS — D2271 Melanocytic nevi of right lower limb, including hip: Secondary | ICD-10-CM | POA: Diagnosis not present

## 2022-10-24 DIAGNOSIS — D2261 Melanocytic nevi of right upper limb, including shoulder: Secondary | ICD-10-CM | POA: Diagnosis not present

## 2022-10-24 DIAGNOSIS — D2262 Melanocytic nevi of left upper limb, including shoulder: Secondary | ICD-10-CM | POA: Diagnosis not present

## 2022-10-24 DIAGNOSIS — L570 Actinic keratosis: Secondary | ICD-10-CM | POA: Diagnosis not present

## 2022-12-11 ENCOUNTER — Telehealth: Payer: Self-pay | Admitting: Internal Medicine

## 2022-12-11 ENCOUNTER — Other Ambulatory Visit: Payer: Self-pay | Admitting: Family

## 2022-12-11 MED ORDER — KETOCONAZOLE 2 % EX CREA: 1.0000 | TOPICAL_CREAM | Freq: Two times a day (BID) | CUTANEOUS | 0 refills | Status: DC

## 2022-12-11 NOTE — Telephone Encounter (Signed)
Patient contacted the office requesting a refill on an old medication, stated it was called ketoconazole 2% cream. Stated he probably has not used it in many years but asked if there was any way Dr. Alphonsus Sias could fill it. Please advise, thank you.

## 2023-01-28 ENCOUNTER — Other Ambulatory Visit: Payer: Self-pay | Admitting: Family

## 2023-02-18 ENCOUNTER — Telehealth: Payer: Self-pay | Admitting: Internal Medicine

## 2023-02-18 MED ORDER — KETOCONAZOLE 2 % EX CREA: 1.0000 | TOPICAL_CREAM | Freq: Two times a day (BID) | CUTANEOUS | 0 refills | Status: DC

## 2023-02-18 NOTE — Telephone Encounter (Signed)
Prescription Request  02/18/2023  LOV: 08/20/2022  What is the name of the medication or equipment? ketoconazole (NIZORAL) 2 % cream   Have you contacted your pharmacy to request a refill? Yes   Which pharmacy would you like this sent to?  [facility] [address] [place] - [address] [place] [address] Phone: [phone] Fax: [phone]    Patient notified that their request is being sent to the clinical staff for review and that they should receive a response within 2 business days.   Please advise at Mobile There is no such number on file (mobile).

## 2023-04-30 ENCOUNTER — Encounter: Payer: Self-pay | Admitting: Internal Medicine

## 2023-04-30 ENCOUNTER — Ambulatory Visit: Payer: Medicare HMO | Admitting: Internal Medicine

## 2023-04-30 VITALS — BP 164/76 | HR 43 | Temp 97.9°F | Ht 63.75 in | Wt 174.0 lb

## 2023-04-30 DIAGNOSIS — N138 Other obstructive and reflux uropathy: Secondary | ICD-10-CM | POA: Diagnosis not present

## 2023-04-30 DIAGNOSIS — E538 Deficiency of other specified B group vitamins: Secondary | ICD-10-CM | POA: Diagnosis not present

## 2023-04-30 DIAGNOSIS — Z0000 Encounter for general adult medical examination without abnormal findings: Secondary | ICD-10-CM | POA: Diagnosis not present

## 2023-04-30 DIAGNOSIS — M170 Bilateral primary osteoarthritis of knee: Secondary | ICD-10-CM | POA: Diagnosis not present

## 2023-04-30 DIAGNOSIS — K5909 Other constipation: Secondary | ICD-10-CM

## 2023-04-30 DIAGNOSIS — I10 Essential (primary) hypertension: Secondary | ICD-10-CM | POA: Diagnosis not present

## 2023-04-30 DIAGNOSIS — N401 Enlarged prostate with lower urinary tract symptoms: Secondary | ICD-10-CM | POA: Diagnosis not present

## 2023-04-30 LAB — CBC
HCT: 40.5 % (ref 39.0–52.0)
Hemoglobin: 13.2 g/dL (ref 13.0–17.0)
MCHC: 32.5 g/dL (ref 30.0–36.0)
MCV: 100.8 fL — ABNORMAL HIGH (ref 78.0–100.0)
Platelets: 257 10*3/uL (ref 150.0–400.0)
RBC: 4.02 Mil/uL — ABNORMAL LOW (ref 4.22–5.81)
RDW: 14.4 % (ref 11.5–15.5)
WBC: 6.3 10*3/uL (ref 4.0–10.5)

## 2023-04-30 LAB — COMPREHENSIVE METABOLIC PANEL
ALT: 11 U/L (ref 0–53)
AST: 19 U/L (ref 0–37)
Albumin: 4 g/dL (ref 3.5–5.2)
Alkaline Phosphatase: 81 U/L (ref 39–117)
BUN: 18 mg/dL (ref 6–23)
CO2: 29 meq/L (ref 19–32)
Calcium: 8.9 mg/dL (ref 8.4–10.5)
Chloride: 104 meq/L (ref 96–112)
Creatinine, Ser: 1.08 mg/dL (ref 0.40–1.50)
GFR: 63.51 mL/min (ref >60.00)
Glucose, Bld: 95 mg/dL (ref 70–99)
Potassium: 4 meq/L (ref 3.5–5.1)
Sodium: 138 meq/L (ref 135–145)
Total Bilirubin: 0.9 mg/dL (ref 0.2–1.2)
Total Protein: 6.9 g/dL (ref 6.0–8.3)

## 2023-04-30 LAB — VITAMIN B12: Vitamin B-12: 612 pg/mL (ref 211–911)

## 2023-04-30 MED ORDER — LINACLOTIDE 290 MCG PO CAPS: 290.0000 ug | ORAL_CAPSULE | Freq: Every day | ORAL | 3 refills | Status: DC

## 2023-04-30 MED ORDER — TRIAMCINOLONE ACETONIDE 0.1 % EX CREA: 1.0000 | TOPICAL_CREAM | Freq: Two times a day (BID) | CUTANEOUS | 1 refills | Status: DC | PRN

## 2023-04-30 MED ORDER — TAMSULOSIN HCL 0.4 MG PO CAPS: 0.4000 mg | ORAL_CAPSULE | Freq: Every day | ORAL | 3 refills | Status: DC

## 2023-04-30 NOTE — Assessment & Plan Note (Signed)
I have personally reviewed the Medicare Annual Wellness questionnaire and have noted 1. The patient's medical and social history 2. Their use of alcohol, tobacco or illicit drugs 3. Their current medications and supplements 4. The patient's functional ability including ADL's, fall risks, home safety risks and hearing or visual             impairment. 5. Diet and physical activities 6. Evidence for depression or mood disorders  The patients weight, height, BMI and visual acuity have been recorded in the chart I have made referrals, counseling and provided education to the patient based review of the above and I have provided the pt with a written personalized care plan for preventive services.  I have provided you with a copy of your personalized plan for preventive services. Please take the time to review along with your updated medication list.  Does exercise Done with cancer screening Not excited about flu or COVID vaccines

## 2023-04-30 NOTE — Patient Instructions (Addendum)
Please try extra strength tylenol --- 2 of the 500mg  tabs in the morning and afternoon. If it isn't any better, I can make a referral to an orthopedist. Please check your blood pressure several times a week--let me know if it is consistently over 150 on the top.

## 2023-04-30 NOTE — Progress Notes (Signed)
Hearing Screening - Comments:: Passed whisper test Vision Screening - Comments:: May 2024  

## 2023-04-30 NOTE — Assessment & Plan Note (Signed)
Uses very low dose of linzess

## 2023-04-30 NOTE — Assessment & Plan Note (Signed)
Symptoms are worse Will try tamsulosin daily

## 2023-04-30 NOTE — Assessment & Plan Note (Signed)
No sig crepitus and ROM is fine Discussed trying regular tylenol Consider ortho evaluation if worsens

## 2023-04-30 NOTE — Assessment & Plan Note (Signed)
BP Readings from Last 3 Encounters:  04/30/23 (!) 164/76  08/20/22 138/78  04/26/22 138/82   Up today---usually up at dentist Thinks it is related to his digestion issues He will monitor at home Recheck in 3 months

## 2023-04-30 NOTE — Assessment & Plan Note (Signed)
Will recheck levels
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
## 2023-05-22 ENCOUNTER — Telehealth: Payer: Self-pay | Admitting: Internal Medicine

## 2023-05-22 NOTE — Telephone Encounter (Signed)
Patient came by and dropped off a log of his blood pressures and a catalog for Dr. Alphonsus Sias. Placed in Dr. Alphonsus Sias box. Thank you!

## 2023-05-22 NOTE — Telephone Encounter (Signed)
Information placed in Dr Karle Starch inbox on his desk.

## 2023-05-23 MED ORDER — VALSARTAN-HYDROCHLOROTHIAZIDE 80-12.5 MG PO TABS: 1.0000 | ORAL_TABLET | Freq: Every day | ORAL | 1 refills | Status: DC

## 2023-05-23 NOTE — Telephone Encounter (Signed)
Sent Rx to pharmacy. He has an appointment already scheduled for 07-31-23.

## 2023-05-23 NOTE — Addendum Note (Signed)
Addended by: Eual Fines on: 05/23/2023 02:29 PM   Modules accepted: Orders

## 2023-05-23 NOTE — Telephone Encounter (Signed)
Spoke to pt. He is scheduled to see Dr Alphonsus Sias 07-31-23. He would like to try and get his BP down on his own. If he is not able to do that in the next month, he will call back to start the BP med.

## 2023-05-23 NOTE — Telephone Encounter (Signed)
Patient called back regarding this, states he is now willing to try this medication. Would like this sent to [facility] pharmacy to be picked up, please advise

## 2023-06-02 ENCOUNTER — Telehealth: Payer: Self-pay | Admitting: Internal Medicine

## 2023-06-02 NOTE — Telephone Encounter (Signed)
Patient friend Todd Hodges came by and dropped off some BP readings for patient. Placed in [place]'s box up front. Thank you!

## 2023-06-02 NOTE — Telephone Encounter (Signed)
Readings placed in Dr Karle Starch inbox on his desk.

## 2023-06-02 NOTE — Telephone Encounter (Signed)
Left message on verified VM for pt that Dr Alphonsus Sias said his BP was much better and to continue taking the BP med and let us know if he has any problems.

## 2023-06-04 ENCOUNTER — Encounter: Payer: Self-pay | Admitting: Internal Medicine

## 2023-06-04 MED ORDER — VALSARTAN 160 MG PO TABS: 160.0000 mg | ORAL_TABLET | Freq: Every day | ORAL | 3 refills | Status: DC

## 2023-06-18 ENCOUNTER — Telehealth: Payer: Self-pay

## 2023-06-18 NOTE — Telephone Encounter (Signed)
Pt brought a list of his BP readings to Dr Alphonsus Sias. He said to let pt know he should be counting the 2nd readings, too. Left message on VM for pt.

## 2023-06-25 DIAGNOSIS — H02889 Meibomian gland dysfunction of unspecified eye, unspecified eyelid: Secondary | ICD-10-CM | POA: Diagnosis not present

## 2023-06-25 DIAGNOSIS — Z961 Presence of intraocular lens: Secondary | ICD-10-CM | POA: Diagnosis not present

## 2023-06-25 DIAGNOSIS — H43813 Vitreous degeneration, bilateral: Secondary | ICD-10-CM | POA: Diagnosis not present

## 2023-07-14 ENCOUNTER — Encounter: Payer: Self-pay | Admitting: Internal Medicine

## 2023-07-17 DIAGNOSIS — D2261 Melanocytic nevi of right upper limb, including shoulder: Secondary | ICD-10-CM | POA: Diagnosis not present

## 2023-07-17 DIAGNOSIS — D0422 Carcinoma in situ of skin of left ear and external auricular canal: Secondary | ICD-10-CM | POA: Diagnosis not present

## 2023-07-17 DIAGNOSIS — Z85828 Personal history of other malignant neoplasm of skin: Secondary | ICD-10-CM | POA: Diagnosis not present

## 2023-07-17 DIAGNOSIS — L570 Actinic keratosis: Secondary | ICD-10-CM | POA: Diagnosis not present

## 2023-07-17 DIAGNOSIS — D485 Neoplasm of uncertain behavior of skin: Secondary | ICD-10-CM | POA: Diagnosis not present

## 2023-07-17 DIAGNOSIS — D2262 Melanocytic nevi of left upper limb, including shoulder: Secondary | ICD-10-CM | POA: Diagnosis not present

## 2023-07-17 DIAGNOSIS — D225 Melanocytic nevi of trunk: Secondary | ICD-10-CM | POA: Diagnosis not present

## 2023-07-17 DIAGNOSIS — D0439 Carcinoma in situ of skin of other parts of face: Secondary | ICD-10-CM | POA: Diagnosis not present

## 2023-07-31 ENCOUNTER — Ambulatory Visit: Payer: Medicare Other | Admitting: Internal Medicine

## 2023-07-31 ENCOUNTER — Encounter: Payer: Self-pay | Admitting: Internal Medicine

## 2023-07-31 VITALS — BP 138/82 | HR 48 | Temp 97.7°F | Ht 63.75 in | Wt 177.0 lb

## 2023-07-31 DIAGNOSIS — K581 Irritable bowel syndrome with constipation: Secondary | ICD-10-CM | POA: Diagnosis not present

## 2023-07-31 DIAGNOSIS — I10 Essential (primary) hypertension: Secondary | ICD-10-CM

## 2023-07-31 LAB — RENAL FUNCTION PANEL
Albumin: 4 g/dL (ref 3.5–5.2)
BUN: 16 mg/dL (ref 6–23)
CO2: 30 meq/L (ref 19–32)
Calcium: 8.7 mg/dL (ref 8.4–10.5)
Chloride: 104 meq/L (ref 96–112)
Creatinine, Ser: 1.09 mg/dL (ref 0.40–1.50)
GFR: 62.7 mL/min (ref >60.00)
Glucose, Bld: 85 mg/dL (ref 70–99)
Phosphorus: 3.3 mg/dL (ref 2.3–4.6)
Potassium: 4.2 meq/L (ref 3.5–5.1)
Sodium: 140 meq/L (ref 135–145)

## 2023-07-31 MED ORDER — VALSARTAN 320 MG PO TABS: 320.0000 mg | ORAL_TABLET | Freq: Every day | ORAL | 3 refills | Status: DC

## 2023-07-31 NOTE — Assessment & Plan Note (Signed)
BP Readings from Last 3 Encounters:  07/31/23 138/82  04/30/23 (!) 164/76  08/20/22 138/78   Doing well on the valsartan 320 mg daily Will check renal profile

## 2023-07-31 NOTE — Assessment & Plan Note (Signed)
Does okay with low dose of linzess (like 28mg  daily)

## 2023-07-31 NOTE — Progress Notes (Signed)
Subjective:    Patient ID: Todd Hodges, male    DOB: 1941/04/24, 83 y.o.   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 30 g 0   linaclotide (LINZESS) 290 MCG CAPS capsule Take 1 capsule (290 mcg total) by mouth daily before breakfast. Or as directed 30 capsule 3   tacrolimus (PROTOPIC) 0.1 % ointment Apply topically 2 (two) times daily as needed.     triamcinolone cream (KENALOG) 0.1 % Apply 1 Application topically 2 (two) times daily as needed. 45 g 1   valsartan (DIOVAN) 160 MG tablet Take 1 tablet (160 mg total) by mouth daily. (Patient taking differently: Take 320 mg by mouth daily.) 90 tablet 3   vitamin B-12 (CYANOCOBALAMIN) 1000 MCG tablet Take 1,000 mcg by mouth daily.     No current facility-administered medications on file prior to visit.    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   cyst left ear   CATARACT EXTRACTION  2010   Bilateral   COLONOSCOPY  06/2016   EYE SURGERY  07/08/2008   cataracts bilaterally   INGUINAL HERNIA REPAIR Right 06/09/2017   Medium Ultra Pro mesh.;  Surgeon: Earline Mayotte, MD;  Location: [facility];  Service: General;  Laterality: Right;   INGUINAL HERNIA REPAIR Left 07/26/2019   Procedure: HERNIA REPAIR INGUINAL ADULT;  Surgeon: Earline Mayotte, MD;  Location: [facility];  Service: General;  Laterality: Left;   POLYPECTOMY      Family History  Problem Relation Age of Onset   Hypertension Father    Cancer Sister        neck   Heart disease Neg Hx    Diabetes Neg Hx    Prostate cancer Neg Hx    Colon cancer Neg Hx     Social History   Socioeconomic History   Marital status: Divorced    Spouse name: Not on file   Number of children: 1   Years of education: Not on file   Highest education level: Master's degree (e.g., MA, MS, MEng, MEd, MSW, MBA)  Occupational History   Occupation: Retired  Comment: as Camera operator  Tobacco Use   Smoking status: Never    Passive exposure: Past   Smokeless tobacco: Never  Vaping Use   Vaping status: Never Used  Substance and Sexual Activity   Alcohol use: No   Drug use: No   Sexual activity: Not on file  Other Topics Concern   Not on file  Social History Narrative   Has living will   [facility] friend, Arnetha Massy, is health care POA   Would accept resuscitation attempts    No feeding tube if cognitively unaware   Social Drivers of Health   Financial Resource Strain: Low Risk  (07/27/2023)   Overall Financial Resource Strain (CARDIA)    Difficulty of Paying Living Expenses: Not hard at all  Food Insecurity: No Food Insecurity (07/27/2023)   Hunger Vital Sign    Worried About Running Out of Food in the Last Year: Never true    Ran Out of  Food in the Last Year: Never true  Transportation Needs: No Transportation Needs (07/27/2023)   PRAPARE - Administrator, Civil Service (Medical): No    Lack of Transportation (Non-Medical): No  Physical Activity: Insufficiently Active (07/27/2023)   Exercise Vital Sign    Days of Exercise per Week: 2 days    Minutes of Exercise per Session: 30 min  Stress: No Stress Concern Present (07/27/2023)   [employer] of Occupational Health - Occupational Stress Questionnaire    Feeling of Stress : Only a little  Social Connections: Moderately Isolated (07/27/2023)   Social Connection and Isolation Panel [NHANES]    Frequency of Communication with Friends and Family: Three times a week    Frequency of Social Gatherings with Friends and Family: Twice a week    Attends Religious Services: More than 4 times per year    Active Member of [employer] or Organizations: No    Attends Engineer, structural: Not on file    Marital Status: Divorced  Catering manager Violence: Not on file   Review of Systems Sleeps fine Appetite is good      Objective:   Physical Exam Constitutional:      Appearance: Normal appearance.  Cardiovascular:     Rate and Rhythm: Regular rhythm. Bradycardia present.     Heart sounds: No murmur heard.    No gallop.  Pulmonary:     Effort: Pulmonary effort is normal.     Breath sounds: Normal breath sounds. No wheezing or rales.  Musculoskeletal:     Cervical back: Neck supple.     Right lower leg: No edema.     Left lower leg: No edema.  Lymphadenopathy:     Cervical: No cervical adenopathy.  Neurological:     Mental Status: He is alert.  Psychiatric:        Mood and Affect: Mood normal.        Behavior: Behavior normal.            Assessment & Plan:

## 2023-08-14 DIAGNOSIS — D0439 Carcinoma in situ of skin of other parts of face: Secondary | ICD-10-CM | POA: Diagnosis not present

## 2023-10-14 DIAGNOSIS — K080 Exfoliation of teeth due to systemic causes: Secondary | ICD-10-CM | POA: Diagnosis not present

## 2023-10-20 DIAGNOSIS — D0422 Carcinoma in situ of skin of left ear and external auricular canal: Secondary | ICD-10-CM | POA: Diagnosis not present

## 2023-10-20 DIAGNOSIS — L905 Scar conditions and fibrosis of skin: Secondary | ICD-10-CM | POA: Diagnosis not present

## 2024-01-20 DIAGNOSIS — L570 Actinic keratosis: Secondary | ICD-10-CM | POA: Diagnosis not present

## 2024-01-20 DIAGNOSIS — D044 Carcinoma in situ of skin of scalp and neck: Secondary | ICD-10-CM | POA: Diagnosis not present

## 2024-01-20 DIAGNOSIS — D2271 Melanocytic nevi of right lower limb, including hip: Secondary | ICD-10-CM | POA: Diagnosis not present

## 2024-01-20 DIAGNOSIS — D2261 Melanocytic nevi of right upper limb, including shoulder: Secondary | ICD-10-CM | POA: Diagnosis not present

## 2024-01-20 DIAGNOSIS — D485 Neoplasm of uncertain behavior of skin: Secondary | ICD-10-CM | POA: Diagnosis not present

## 2024-01-20 DIAGNOSIS — D0439 Carcinoma in situ of skin of other parts of face: Secondary | ICD-10-CM | POA: Diagnosis not present

## 2024-01-20 DIAGNOSIS — D2262 Melanocytic nevi of left upper limb, including shoulder: Secondary | ICD-10-CM | POA: Diagnosis not present

## 2024-01-20 DIAGNOSIS — D2272 Melanocytic nevi of left lower limb, including hip: Secondary | ICD-10-CM | POA: Diagnosis not present

## 2024-02-24 DIAGNOSIS — G8929 Other chronic pain: Secondary | ICD-10-CM | POA: Diagnosis not present

## 2024-03-11 ENCOUNTER — Ambulatory Visit (INDEPENDENT_AMBULATORY_CARE_PROVIDER_SITE_OTHER)

## 2024-03-11 VITALS — BP 126/66 | Ht 63.75 in | Wt 175.0 lb

## 2024-03-11 DIAGNOSIS — Z2821 Immunization not carried out because of patient refusal: Secondary | ICD-10-CM

## 2024-03-11 DIAGNOSIS — Z0000 Encounter for general adult medical examination without abnormal findings: Secondary | ICD-10-CM | POA: Diagnosis not present

## 2024-03-11 NOTE — Progress Notes (Signed)
 Because this visit was a virtual/telehealth visit,  certain criteria was not obtained, such a blood pressure, CBG if applicable, and timed get up and go. Any medications not marked as taking were not mentioned during the medication reconciliation part of the visit. Any vitals not documented were not able to be obtained due to this being a telehealth visit or patient was unable to self-report a recent blood pressure reading due to a lack of equipment at home via telehealth. Vitals that have been documented are verbally provided by the patient.   This visit was performed by a medical professional under my direct supervision. I was immediately available for consultation/collaboration. I have reviewed and agree with the Annual Wellness Visit documentation.  Subjective:   Todd Hodges is a 83 y.o. who presents for a Medicare Wellness preventive visit.  As a reminder, Annual Wellness Visits don't include a physical exam, and some assessments may be limited, especially if this visit is performed virtually. We may recommend an in-person follow-up visit with your provider if needed.  Visit Complete: Virtual I connected with  [name] on 03/11/24 by a audio enabled telemedicine application and verified that I am speaking with the correct person using two identifiers.  Patient Location: Home  Provider Location: Home Office  I discussed the limitations of evaluation and management by telemedicine. The patient expressed understanding and agreed to proceed.  Vital Signs: Because this visit was a virtual/telehealth visit, some criteria may be missing or patient reported. Any vitals not documented were not able to be obtained and vitals that have been documented are patient reported.  VideoDeclined- This patient declined Librarian, academic. Therefore the visit was completed with audio only.  Persons Participating in Visit: Patient.  AWV Questionnaire: No:  Patient Medicare AWV questionnaire was not completed prior to this visit.  Cardiac Risk Factors include: advanced age (>53men, >23 women);male gender;obesity (BMI >30kg/m2);hypertension     Objective:    Today's Vitals   03/11/24 0840 03/11/24 0841  BP: 126/66   Weight: 175 lb (79.4 kg)   Height: 5' 3.75 (1.619 m)   PainSc:  2    Body mass index is 30.27 kg/m.     03/11/2024    8:45 AM 07/23/2019   11:37 AM 06/09/2017    8:32 AM 06/03/2017    4:28 PM 06/10/2016    9:51 AM 01/04/2015    9:59 AM  Advanced Directives  Does Patient Have a Medical Advance Directive? Yes Yes Yes  Yes  Yes  Yes   Type of Estate agent of [place];Living will Healthcare Power of [place];Living will Healthcare Power of [place];Living will Healthcare Power of [employer] of [place];Living will Healthcare Power of [place];Living will   Does patient want to make changes to medical advance directive? No - Patient declined No - Patient declined   Yes (Inpatient - patient requests chaplain consult to change a medical advance directive)  No - Patient declined   Copy of Healthcare Power of Attorney in Chart? Yes - validated most recent copy scanned in chart (See row information) No - copy requested No - copy requested    No - copy requested      Data saved with a previous flowsheet row definition    Current Medications (verified) Outpatient Encounter Medications as of 03/11/2024  Medication Sig   ketoconazole  (NIZORAL ) 2 % cream Apply 1 Application topically 2 (two) times daily.   linaclotide  (LINZESS ) 290 MCG CAPS capsule Take 1  capsule (290 mcg total) by mouth daily before breakfast. Or as directed   tacrolimus (PROTOPIC) 0.1 % ointment Apply topically 2 (two) times daily as needed.   triamcinolone  cream (KENALOG ) 0.1 % Apply 1 Application topically 2 (two) times daily as needed.   valsartan  (DIOVAN ) 320 MG tablet Take 1 tablet (320 mg total) by mouth daily.   vitamin B-12  (CYANOCOBALAMIN ) 1000 MCG tablet Take 1,000 mcg by mouth daily.   No facility-administered encounter medications on file as of 03/11/2024.    Allergies (verified) Patient has no known allergies.   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   cyst left ear   CATARACT EXTRACTION  2010   Bilateral   COLONOSCOPY  06/2016   EYE SURGERY  07/08/2008   cataracts bilaterally   INGUINAL HERNIA REPAIR Right 06/09/2017   Medium Ultra Pro mesh.;  Surgeon: Dessa Reyes ORN, MD;  Location: [facility];  Service: General;  Laterality: Right;   INGUINAL HERNIA REPAIR Left 07/26/2019   Procedure: HERNIA REPAIR INGUINAL ADULT;  Surgeon: Dessa Reyes ORN, MD;  Location: [facility];  Service: General;  Laterality: Left;   POLYPECTOMY     Family History  Problem Relation Age of Onset   Hypertension Father    Cancer Sister        neck   Heart disease Neg Hx    Diabetes Neg Hx    Prostate cancer Neg Hx    Colon cancer Neg Hx    Social History   Socioeconomic History   Marital status: Divorced    Spouse name: Not on file   Number of children: 1   Years of education: Not on file   Highest education level: Master's degree (e.g., MA, MS, MEng, MEd, MSW, MBA)  Occupational History   Occupation: Retired    Comment: as Camera operator  Tobacco Use   Smoking status: Never    Passive exposure: Past   Smokeless tobacco: Never  Vaping Use   Vaping status: Never Used  Substance and Sexual Activity   Alcohol  use: No   Drug use: No   Sexual activity: Not on file  Other Topics Concern   Not on file  Social History Narrative   Has living will   [place] friend, Arlyne Chin, is health care POA   Would accept resuscitation attempts    No feeding tube if cognitively unaware   Social Drivers of Health   Financial Resource Strain: Low Risk  (03/11/2024)   Overall Financial Resource Strain (CARDIA)    Difficulty of Paying Living Expenses: Not hard at all  Food Insecurity: No Food Insecurity (03/11/2024)   Hunger Vital Sign    Worried About Running Out of Food in the Last Year: Never true    Ran Out of Food in the Last Year: Never true  Transportation Needs: No  Transportation Needs (03/11/2024)   PRAPARE - Administrator, Civil Service (Medical): No    Lack of Transportation (Non-Medical): No  Physical Activity: Insufficiently Active (03/11/2024)   Exercise Vital Sign    Days of Exercise per Week: 1 day    Minutes of Exercise per Session: 30 min  Stress: No Stress Concern Present (03/11/2024)   [employer] of Occupational Health - Occupational Stress Questionnaire    Feeling of Stress: Not at all  Social Connections: Moderately Isolated (03/11/2024)   Social Connection and Isolation Panel    Frequency of Communication with Friends and Family: Twice a week    Frequency of Social Gatherings with Friends and Family: Twice a week    Attends Religious Services: More than 4 times per year    Active Member of [employer] or Organizations: No    Attends Engineer, structural: Never    Marital Status: Divorced    Tobacco Counseling Counseling given: Not Answered    Clinical Intake:  Pre-visit preparation completed: Yes  Pain : 0-10 Pain Score: 2  Pain Type: Chronic pain Pain Location: Knee Pain Orientation: Left, Right Pain Descriptors / Indicators: Aching Pain Onset: Today Pain Frequency: Several days a week     BMI - recorded: 30.27 Nutritional Status: BMI > 30   Obese Nutritional Risks: None Diabetes: No  No results found for: HGBA1C   How often do you need to have someone help you when you read instructions, pamphlets, or other written materials from your doctor or pharmacy?: 1 - Never  Interpreter Needed?: No  Information entered by :: Eulis Salazar,CMA   Activities of Daily Living     03/11/2024    8:43 AM  In your present state of health, do you have any difficulty performing the following activities:  Hearing? 0  Vision? 0  Difficulty concentrating or making decisions? 1  Comment remembering  Walking or climbing stairs? 1  Dressing or bathing? 0  Doing errands, shopping? 0  Preparing Food and eating ? N  Using the Toilet? N  In the past six months, have you accidently leaked urine? Y  Do you have problems with loss of bowel control? Y  Managing your Medications? N  Managing your Finances? N  Housekeeping or managing your Housekeeping? Y  Comment patient has some help once a month    Patient Care Team: Jimmy Charlie FERNS, MD as PCP - General  I have updated your Care Teams any recent Medical Services you may have received from other providers in the past year.     Assessment:   This is a routine wellness examination for [place].  Hearing/Vision screen Hearing Screening - Comments:: No difficulties Vision Screening - Comments:: Patient wears glasses   Goals Addressed               This Visit's Progress     Patient Stated (pt-stated)        Patient declined goals       Depression Screen     03/11/2024    8:46 AM 04/30/2023    9:51 AM 04/30/2023    9:12 AM 04/26/2022   10:59 AM 04/13/2021    8:00 AM 03/17/2019    8:58 AM 03/17/2019    8:15 AM  PHQ 2/9 Scores  PHQ - 2 Score 0 0 0 0 0 0 0  PHQ- 9 Score 1      0    Fall Risk     03/11/2024  8:45 AM 04/30/2023    9:51 AM 04/30/2023    9:11 AM 04/26/2022   10:59 AM 04/13/2021    8:00 AM  Fall Risk   Falls in the past year? 0 0 0 0 0  Number falls in  past yr: 0  0    Injury with Fall? 0  0    Risk for fall due to : No Fall Risks  No Fall Risks    Follow up Falls evaluation completed  Falls evaluation completed      MEDICARE RISK AT HOME:  Medicare Risk at Home Any stairs in or around the home?: Yes If so, are there any without handrails?: No Home free of loose throw rugs in walkways, pet beds, electrical cords, etc?: Yes Adequate lighting in your home to reduce risk of falls?: Yes Life alert?: No Use of a cane, walker or w/c?: No Grab bars in the bathroom?: Yes Shower chair or bench in shower?: Yes Elevated toilet seat or a handicapped toilet?: Yes  TIMED UP AND GO:  Was the test performed?  No  Cognitive Function: 6CIT completed        03/11/2024    8:42 AM  6CIT Screen  What Year? 0 points  What month? 0 points  What time? 0 points  Count back from 20 0 points  Months in reverse 0 points  Repeat phrase 0 points  Total Score 0 points    Immunizations Immunization History  Administered Date(s) Administered   Fluad Quad(high Dose 65+) 03/22/2020, 04/13/2021   INFLUENZA, HIGH DOSE SEASONAL PF 04/05/2019   PFIZER Comirnaty(Gray Top)Covid-19 Tri-Sucrose Vaccine 11/12/2020   PFIZER(Purple Top)SARS-COV-2 Vaccination 08/10/2019, 08/31/2019, 04/17/2020, 11/12/2020   Pneumococcal Conjugate-13 12/31/2013   Pneumococcal Polysaccharide-23 06/07/2005, 02/28/2017   Td 07/09/1997, 09/09/2008, 07/28/2020   Zoster, Live 01/16/2010    Screening Tests Health Maintenance  Topic Date Due   Colonoscopy  06/24/2021   INFLUENZA VACCINE  02/06/2024   COVID-19 Vaccine (6 - 2025-26 season) 03/08/2024   Zoster Vaccines- Shingrix (1 of 2) 07/08/2024 (Originally 12/03/1959)   Medicare Annual Wellness (AWV)  03/11/2025   DTaP/Tdap/Td (4 - Tdap) 07/28/2030   Pneumococcal Vaccine: 50+ Years  Completed   HPV VACCINES  Aged Out   Meningococcal B Vaccine  Aged Out    Health Maintenance  Health Maintenance Due  Topic Date Due    Colonoscopy  06/24/2021   INFLUENZA VACCINE  02/06/2024   COVID-19 Vaccine (6 - 2025-26 season) 03/08/2024   Health Maintenance Items Addressed:patient declined appointment    Additional Screening:  Vision Screening: Recommended annual ophthalmology exams for early detection of glaucoma and other disorders of the eye. Would you like a referral to an eye doctor? No    Dental Screening: Recommended annual dental exams for proper oral hygiene  Community Resource Referral / Chronic Care Management: CRR required this visit?  No   CCM required this visit?  No   Plan:    I have personally reviewed and noted the following in the patient's chart:   Medical and social history Use of alcohol, tobacco or illicit drugs  Current medications and supplements including opioid prescriptions. Patient is not currently taking opioid prescriptions. Functional ability and status Nutritional status Physical activity Advanced directives List of other physicians Hospitalizations, surgeries, and ER visits in previous 12 months Vitals Screenings to include cognitive, depression, and falls Referrals and appointments  In addition, I have reviewed and discussed with patient certain preventive protocols, quality metrics, and best practice recommendations. A  written personalized care plan for preventive services as well as general preventive health recommendations were provided to patient.   Lyle MARLA Right, [place]   03/11/2024   After Visit Summary: (MyChart) Due to this being a telephonic visit, the after visit summary with patients personalized plan was offered to patient via MyChart   Notes: Nothing significant to report at this time.

## 2024-03-11 NOTE — Patient Instructions (Signed)
 Todd Hodges , Thank you for taking time out of your busy schedule to complete your Annual Wellness Visit with me. I enjoyed our conversation and look forward to speaking with you again next year. I, as well as your care team,  appreciate your ongoing commitment to your health goals. Please review the following plan we discussed and let me know if I can assist you in the future. Your Game plan/ To Do List    Referrals: If you haven't heard from the office you've been referred to, please reach out to them at the phone provided.   Follow up Visits: We will see or speak with you next year for your Next Medicare AWV with our clinical staff Have you seen your provider in the last 6 months (3 months if uncontrolled diabetes)? No  Clinician Recommendations:  Aim for 30 minutes of exercise or brisk walking, 6-8 glasses of water, and 5 servings of fruits and vegetables each day.       This is a list of the screenings recommended for you:  Health Maintenance  Topic Date Due   Colon Cancer Screening  06/24/2021   Flu Shot  02/06/2024   COVID-19 Vaccine (6 - 2025-26 season) 03/08/2024   Zoster (Shingles) Vaccine (1 of 2) 07/08/2024*   Medicare Annual Wellness Visit  03/11/2025   DTaP/Tdap/Td vaccine (4 - Tdap) 07/28/2030   Pneumococcal Vaccine for age over 14  Completed   HPV Vaccine  Aged Out   Meningitis B Vaccine  Aged Out  *Topic was postponed. The date shown is not the original due date.    Advanced directives: (In Chart) A copy of your advanced directives are scanned into your chart should your provider ever need it. Advance Care Planning is important because it:  [x]  Makes sure you receive the medical care that is consistent with your values, goals, and preferences  [x]  It provides guidance to your family and loved ones and reduces their decisional burden about whether or not they are making the right decisions based on your wishes.  Follow the link provided in your after visit summary  or read over the paperwork we have mailed to you to help you started getting your Advance Directives in place. If you need assistance in completing these, please reach out to us  so that we can help you!  See attachments for Preventive Care and Fall Prevention Tips.

## 2024-03-23 DIAGNOSIS — D0439 Carcinoma in situ of skin of other parts of face: Secondary | ICD-10-CM | POA: Diagnosis not present

## 2024-03-30 DIAGNOSIS — D044 Carcinoma in situ of skin of scalp and neck: Secondary | ICD-10-CM | POA: Diagnosis not present

## 2024-04-20 DIAGNOSIS — K080 Exfoliation of teeth due to systemic causes: Secondary | ICD-10-CM | POA: Diagnosis not present

## 2024-04-29 ENCOUNTER — Ambulatory Visit: Payer: Medicare Other | Admitting: Nurse Practitioner

## 2024-04-29 ENCOUNTER — Encounter: Payer: Self-pay | Admitting: Nurse Practitioner

## 2024-04-29 ENCOUNTER — Telehealth: Payer: Self-pay

## 2024-04-29 VITALS — BP 138/70 | HR 52 | Temp 97.9°F | Ht 63.25 in | Wt 173.8 lb

## 2024-04-29 DIAGNOSIS — K581 Irritable bowel syndrome with constipation: Secondary | ICD-10-CM

## 2024-04-29 DIAGNOSIS — Z0000 Encounter for general adult medical examination without abnormal findings: Secondary | ICD-10-CM

## 2024-04-29 DIAGNOSIS — M25561 Pain in right knee: Secondary | ICD-10-CM

## 2024-04-29 DIAGNOSIS — N401 Enlarged prostate with lower urinary tract symptoms: Secondary | ICD-10-CM | POA: Diagnosis not present

## 2024-04-29 DIAGNOSIS — M25562 Pain in left knee: Secondary | ICD-10-CM | POA: Diagnosis not present

## 2024-04-29 DIAGNOSIS — Z125 Encounter for screening for malignant neoplasm of prostate: Secondary | ICD-10-CM

## 2024-04-29 DIAGNOSIS — G8929 Other chronic pain: Secondary | ICD-10-CM

## 2024-04-29 DIAGNOSIS — I10 Essential (primary) hypertension: Secondary | ICD-10-CM | POA: Diagnosis not present

## 2024-04-29 DIAGNOSIS — N138 Other obstructive and reflux uropathy: Secondary | ICD-10-CM

## 2024-04-29 DIAGNOSIS — Z7189 Other specified counseling: Secondary | ICD-10-CM

## 2024-04-29 DIAGNOSIS — E538 Deficiency of other specified B group vitamins: Secondary | ICD-10-CM

## 2024-04-29 LAB — COMPREHENSIVE METABOLIC PANEL WITH GFR
ALT: 11 U/L (ref 0–53)
AST: 17 U/L (ref 0–37)
Albumin: 4.1 g/dL (ref 3.5–5.2)
Alkaline Phosphatase: 71 U/L (ref 39–117)
BUN: 20 mg/dL (ref 6–23)
CO2: 29 meq/L (ref 19–32)
Calcium: 8.7 mg/dL (ref 8.4–10.5)
Chloride: 105 meq/L (ref 96–112)
Creatinine, Ser: 1.11 mg/dL (ref 0.40–1.50)
GFR: 61.02 mL/min (ref >60.00)
Glucose, Bld: 48 mg/dL — CL (ref 70–99)
Potassium: 4 meq/L (ref 3.5–5.1)
Sodium: 142 meq/L (ref 135–145)
Total Bilirubin: 0.7 mg/dL (ref 0.2–1.2)
Total Protein: 6.4 g/dL (ref 6.0–8.3)

## 2024-04-29 LAB — CBC
HCT: 37.4 % — ABNORMAL LOW (ref 39.0–52.0)
Hemoglobin: 12.5 g/dL — ABNORMAL LOW (ref 13.0–17.0)
MCHC: 33.3 g/dL (ref 30.0–36.0)
MCV: 100 fl (ref 78.0–100.0)
Platelets: 209 K/uL (ref 150.0–400.0)
RBC: 3.74 Mil/uL — ABNORMAL LOW (ref 4.22–5.81)
RDW: 14.6 % (ref 11.5–15.5)
WBC: 5.6 K/uL (ref 4.0–10.5)

## 2024-04-29 LAB — VITAMIN B12: Vitamin B-12: 529 pg/mL (ref 211–911)

## 2024-04-29 LAB — TSH: TSH: 2.1 u[IU]/mL (ref 0.35–5.50)

## 2024-04-29 LAB — PSA, MEDICARE: PSA: 7.79 ng/mL — ABNORMAL HIGH (ref 0.10–4.00)

## 2024-04-29 MED ORDER — LINACLOTIDE 290 MCG PO CAPS: 290.0000 ug | ORAL_CAPSULE | Freq: Every day | ORAL | 3 refills | Status: AC

## 2024-04-29 MED ORDER — TRIAMCINOLONE ACETONIDE 0.1 % EX CREA: 1.0000 | TOPICAL_CREAM | Freq: Two times a day (BID) | CUTANEOUS | 1 refills | Status: AC | PRN

## 2024-04-29 NOTE — Assessment & Plan Note (Signed)
 History of the same orally repleting it daily pending B12 level today

## 2024-04-29 NOTE — Assessment & Plan Note (Signed)
 Discussed age-appropriate immunizations and screening exams.  Did review patient's personal, surgical, social, family histories.  Patient is up-to-date on all age-appropriate vaccinations he would like.  Refused flu vaccine today.  Did discuss about updating shingles vaccine to Shingrix at local pharmacy.  Patient does not want to pursue CRC at this juncture.  He is amendable to do PSA today this has been ordered.  Patient was given information at discharge about preventative healthcare maintenance with anticipatory guidance.

## 2024-04-29 NOTE — Assessment & Plan Note (Signed)
 Currently maintained on Flomax  0.4 mg daily.  Still has some nocturia.  Patient asked about coming off medication to discuss the possibility of increased urination and decreased flow pressure and increased nocturia.

## 2024-04-29 NOTE — Telephone Encounter (Signed)
 Ernst, of [employer], calling with following critical lab results:  Glucose- 48

## 2024-04-29 NOTE — Assessment & Plan Note (Signed)
 Patient currently maintained on valsartan  320 mg daily.  Checks blood pressure often most readings within acceptable limits.  Patient is tolerating medication well.  Continue medication as prescribed

## 2024-04-29 NOTE — Assessment & Plan Note (Signed)
 History of the same is getting worse.  He has tried Tylenol  without great relief.  Patient would like to see a specialist.  Orthopedics referral placed.  Patient sounds like he is having some meniscal involvement

## 2024-04-29 NOTE — Telephone Encounter (Signed)
 Called and spoke with patient. He has eaten and felt fine. Nothing further needed at this time

## 2024-04-29 NOTE — Assessment & Plan Note (Signed)
 Patient has advanced directive on file.

## 2024-04-29 NOTE — Progress Notes (Signed)
 Established Patient Office Visit  Subjective   Patient ID: Todd Hodges, male    DOB: Oct 06, 1940  Age: 83 y.o. MRN: [id number]  Chief Complaint  Patient presents with   Transitions Of Care    HPI  HTN: Patient currently maintained on valsartan  320 mg daily. He does check th eblood pressure frequently at home   Constipation: Patient currently maintained on Linzess  290 mcg daily. States that he has had IBS since his 107s. He has had upper and lower GIs. States that he did a bulking agent. State that he did well for approx 20 years. Statse that he was started on linzess  at 142mcg and it worked well. States that he decreased the dose and it was working well. States that he is doing the 290 and dividing it into 14 caps. States that he tries to go daily.   Skin cancer: Followed by dermatology, Dr. Dela  for complete physical and follow up of chronic conditions.  Immunizations: -Tetanus: Completed in 2022 -Influenza: refused -Shingles: Completed Zostavax live, get shingrix at local pharmacy  -Pneumonia: Completed   Diet: Fair diet. He will eat 3 meals a day and does not snack much. He will do coffee and sweet tea and sometimes juice Exercise:  [facility]   Eye exam: Completes annually. Wears glasses.  Dental exam: Completes semi-annually    Colonoscopy: Completed in 06/24/2016, repeat 5 years if patient is willing. Declines currently   Lung Cancer Screening: NA   PSA: Due. Willing to get but if elevated he does not want to pursue radical treatment   Advance directive: Patient does have a living well on file from 01/17/2015       Review of Systems  Constitutional:  Negative for chills and fever.  Respiratory:  Negative for shortness of breath.   Cardiovascular:  Negative for chest pain and leg swelling.  Gastrointestinal:  Positive for constipation. Negative for abdominal pain, blood in stool, diarrhea, nausea and  vomiting.  Genitourinary:  Negative for dysuria and hematuria.       Nocturia +  Neurological:  Negative for tingling and headaches.  Psychiatric/Behavioral:  Negative for hallucinations and suicidal ideas.       Objective:     BP 138/70   Pulse (!) 52   Temp 97.9 F (36.6 C) (Oral)   Ht 5' 3.25 (1.607 m)   Wt 173 lb 12.8 oz (78.8 kg)   SpO2 95%   BMI 30.54 kg/m  BP Readings from Last 3 Encounters:  04/29/24 138/70  03/11/24 126/66  07/31/23 138/82   Wt Readings from Last 3 Encounters:  04/29/24 173 lb 12.8 oz (78.8 kg)  03/11/24 175 lb (79.4 kg)  07/31/23 177 lb (80.3 kg)   SpO2 Readings from Last 3 Encounters:  04/29/24 95%  07/31/23 96%  04/30/23 95%      Physical Exam Vitals and nursing note reviewed.  Constitutional:      Appearance: Normal appearance.  HENT:     Right Ear: Tympanic membrane, ear canal and external ear normal.     Left Ear: Tympanic membrane, ear canal and external ear normal.     Mouth/Throat:     Mouth: Mucous membranes are moist.     Pharynx: Oropharynx is clear.  Eyes:     Extraocular Movements: Extraocular movements intact.     Pupils: Pupils are equal, round, and reactive to light.  Cardiovascular:  Rate and Rhythm: Normal rate and regular rhythm.     Pulses: Normal pulses.     Heart sounds: Normal heart sounds.  Pulmonary:     Effort: Pulmonary effort is normal.     Breath sounds: Normal breath sounds.  Abdominal:     General: Bowel sounds are normal. There is no distension.     Palpations: There is no mass.     Tenderness: There is no abdominal tenderness.     Hernia: No hernia is present.  Musculoskeletal:     Right lower leg: No edema.     Left lower leg: No edema.  Lymphadenopathy:     Cervical: No cervical adenopathy.  Skin:    General: Skin is warm.  Neurological:     General: No focal deficit present.     Mental Status: He is alert.     Deep Tendon Reflexes:     Reflex Scores:      Bicep reflexes are  2+ on the right side and 2+ on the left side.      Patellar reflexes are 2+ on the right side and 2+ on the left side.    Comments: Bilateral upper and lower extremity strength 5/5  Psychiatric:        Mood and Affect: Mood normal.        Behavior: Behavior normal.        Thought Content: Thought content normal.        Judgment: Judgment normal.      No results found for any visits on 04/29/24.    The ASCVD Risk score (Arnett DK, et al., 2019) failed to calculate for the following reasons:   The 2019 ASCVD risk score is only valid for ages 49 to 71    Assessment & Plan:   Problem List Items Addressed This Visit       Cardiovascular and Mediastinum   Hypertension   Patient currently maintained on valsartan  320 mg daily.  Checks blood pressure often most readings within acceptable limits.  Patient is tolerating medication well.  Continue medication as prescribed      Relevant Orders   CBC   Comprehensive metabolic panel with GFR   TSH     Digestive   IBS   History of the same currently maintained on Linzess  continue      Relevant Medications   linaclotide  (LINZESS ) 290 MCG CAPS capsule     Genitourinary   BPH with obstruction/lower urinary tract symptoms   Currently maintained on Flomax  0.4 mg daily.  Still has some nocturia.  Patient asked about coming off medication to discuss the possibility of increased urination and decreased flow pressure and increased nocturia.      Relevant Medications   tamsulosin  (FLOMAX ) 0.4 MG CAPS capsule     Other   Routine general medical examination at a health care facility - Primary   Discussed age-appropriate immunizations and screening exams.  Did review patient's personal, surgical, social, family histories.  Patient is up-to-date on all age-appropriate vaccinations he would like.  Refused flu vaccine today.  Did discuss about updating shingles vaccine to Shingrix at local pharmacy.  Patient does not want to pursue CRC at this  juncture.  He is amendable to do PSA today this has been ordered.  Patient was given information at discharge about preventative healthcare maintenance with anticipatory guidance.      Relevant Orders   CBC   Comprehensive metabolic panel with GFR   Vitamin B12 deficiency  History of the same orally repleting it daily pending B12 level today      Relevant Orders   Vitamin B12   Advance directive discussed with patient   Patient has advanced directive on file.      Chronic pain of both knees   History of the same is getting worse.  He has tried Tylenol  without great relief.  Patient would like to see a specialist.  Orthopedics referral placed.  Patient sounds like he is having some meniscal involvement      Relevant Orders   Ambulatory referral to Orthopedic Surgery   Other Visit Diagnoses       Screening for prostate cancer       Relevant Orders   PSA, Medicare       Return in about 6 months (around 10/28/2024) for BP recheck.    Adina Crandall, NP

## 2024-04-29 NOTE — Assessment & Plan Note (Signed)
 History of the same currently maintained on Linzess  continue

## 2024-04-29 NOTE — Patient Instructions (Signed)
 Nice to see you today Talk to the pharmacy about getting the Shingrix vaccine Follow up with me in 6 months, sooner if you need me

## 2024-04-30 ENCOUNTER — Ambulatory Visit: Payer: Self-pay | Admitting: Nurse Practitioner

## 2024-04-30 DIAGNOSIS — R7989 Other specified abnormal findings of blood chemistry: Secondary | ICD-10-CM

## 2024-05-06 ENCOUNTER — Other Ambulatory Visit

## 2024-05-06 DIAGNOSIS — R7989 Other specified abnormal findings of blood chemistry: Secondary | ICD-10-CM

## 2024-05-06 LAB — IBC + FERRITIN
Ferritin: 64.8 ng/mL (ref 22.0–322.0)
Iron: 84 ug/dL (ref 42–165)
Saturation Ratios: 34.3 % (ref 20.0–50.0)
TIBC: 245 ug/dL — ABNORMAL LOW (ref 250.0–450.0)
Transferrin: 175 mg/dL — ABNORMAL LOW (ref 212.0–360.0)

## 2024-05-06 LAB — FOLATE: Folate: >23.7 ng/mL (ref >5.9)

## 2024-05-10 ENCOUNTER — Ambulatory Visit: Payer: Self-pay | Admitting: Nurse Practitioner

## 2024-05-10 DIAGNOSIS — D509 Iron deficiency anemia, unspecified: Secondary | ICD-10-CM

## 2024-05-10 MED ORDER — IRON (FERROUS SULFATE) 325 (65 FE) MG PO TABS: ORAL_TABLET | ORAL | 0 refills | Status: AC

## 2024-06-14 ENCOUNTER — Encounter: Payer: Self-pay | Admitting: Nurse Practitioner

## 2024-06-14 DIAGNOSIS — R7989 Other specified abnormal findings of blood chemistry: Secondary | ICD-10-CM

## 2024-06-15 ENCOUNTER — Other Ambulatory Visit

## 2024-06-15 DIAGNOSIS — D509 Iron deficiency anemia, unspecified: Secondary | ICD-10-CM | POA: Diagnosis not present

## 2024-06-15 LAB — CBC
HCT: 36.7 % — ABNORMAL LOW (ref 39.0–52.0)
Hemoglobin: 12.3 g/dL — ABNORMAL LOW (ref 13.0–17.0)
MCHC: 33.5 g/dL (ref 30.0–36.0)
MCV: 100.5 fl — ABNORMAL HIGH (ref 78.0–100.0)
Platelets: 193 K/uL (ref 150.0–400.0)
RBC: 3.66 Mil/uL — ABNORMAL LOW (ref 4.22–5.81)
RDW: 14.6 % (ref 11.5–15.5)
WBC: 5.8 K/uL (ref 4.0–10.5)

## 2024-06-15 LAB — IBC + FERRITIN
Ferritin: 55.8 ng/mL (ref 22.0–322.0)
Iron: 106 ug/dL (ref 42–165)
Saturation Ratios: 41.8 % (ref 20.0–50.0)
TIBC: 253.4 ug/dL (ref 250.0–450.0)
Transferrin: 181 mg/dL — ABNORMAL LOW (ref 212.0–360.0)

## 2024-06-16 ENCOUNTER — Ambulatory Visit: Payer: Self-pay | Admitting: Nurse Practitioner

## 2024-06-18 ENCOUNTER — Encounter: Payer: Self-pay | Admitting: *Deleted

## 2024-06-22 DIAGNOSIS — M170 Bilateral primary osteoarthritis of knee: Secondary | ICD-10-CM | POA: Diagnosis not present

## 2024-07-09 ENCOUNTER — Other Ambulatory Visit: Payer: Self-pay | Admitting: Nurse Practitioner

## 2024-07-09 NOTE — Telephone Encounter (Signed)
 Copied from CRM [id number]. Topic: Clinical - Medication Refill >> Jul 09, 2024 12:28 PM Deleta S wrote: Medication: tamsulosin  (FLOMAX ) 0.4 MG CAPS capsule  Has the patient contacted their pharmacy? Yes (Agent: If no, request that the patient contact the pharmacy for the refill. If patient does not wish to contact the pharmacy document the reason why and proceed with request.) (Agent: If yes, when and what did the pharmacy advise?)  This is the patient's preferred pharmacy:  [facility] [address], [place] - [address] [place] [address] Phone: [phone] Fax: [phone]  Is this the correct pharmacy for this prescription? Yes If no, delete pharmacy and type the correct one.   Has the prescription been filled recently? Yes  Is the patient out of the medication? No  Has the patient been seen for an appointment in the last year OR does the patient have an upcoming appointment? Yes  Can we respond through MyChart? No  Agent: Please be advised that Rx refills may take up to 3 business days. We ask that you follow-up with your pharmacy.

## 2024-07-12 MED ORDER — TAMSULOSIN HCL 0.4 MG PO CAPS: 0.4000 mg | ORAL_CAPSULE | Freq: Every day | ORAL | 1 refills | Status: AC

## 2024-08-06 ENCOUNTER — Other Ambulatory Visit: Payer: Self-pay | Admitting: Nurse Practitioner

## 2024-08-06 NOTE — Telephone Encounter (Unsigned)
 Copied from CRM #[id number]. Topic: Clinical - Medication Refill >> Aug 06, 2024 10:30 AM Shereese L wrote: Medication:  valsartan  (DIOVAN ) 320 MG tablet   Has the patient contacted their pharmacy? Yes (Agent: If no, request that the patient contact the pharmacy for the refill. If patient does not wish to contact the pharmacy document the reason why and proceed with request.) (Agent: If yes, when and what did the pharmacy advise?)  This is the patient's preferred pharmacy:  [facility] [address] [place] - [address] [place] [address] Phone: [phone] Fax: [phone]  Is this the correct pharmacy for this prescription? Yes If no, delete pharmacy and type the correct one.   Has the prescription been filled recently? Yes  Is the patient out of the medication? Yes  Has the patient been seen for an appointment in the last year OR does the patient have an upcoming appointment? Yes  Can we respond through MyChart? Yes  Agent: Please be advised that Rx refills may take up to 3 business days. We ask that you follow-up with your pharmacy.

## 2024-08-08 ENCOUNTER — Encounter: Payer: Self-pay | Admitting: Nurse Practitioner

## 2024-08-09 MED ORDER — VALSARTAN 320 MG PO TABS: 320.0000 mg | ORAL_TABLET | Freq: Every day | ORAL | 3 refills | Status: AC

## 2024-10-28 ENCOUNTER — Ambulatory Visit: Admitting: Nurse Practitioner

## 2025-03-22 ENCOUNTER — Ambulatory Visit
